# Patient Record
Sex: Female | Born: 1955 | Race: White | Hispanic: No | Marital: Married | Smoking: Never smoker
Health system: Southern US, Community
[De-identification: ages and names within clinical notes are randomized; demographics above are authoritative.]

## PROBLEM LIST (undated history)

## (undated) DIAGNOSIS — Z9889 Other specified postprocedural states: Secondary | ICD-10-CM

## (undated) HISTORY — PX: TUBAL LIGATION: SHX77

---
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
## 1997-09-03 HISTORY — PX: CHOLECYSTECTOMY: SHX55

## 2003-09-05 LAB — HM COLONOSCOPY: HM COLON: NORMAL

## 2009-11-02 ENCOUNTER — Ambulatory Visit: Payer: Self-pay | Admitting: Family Medicine

## 2010-09-03 HISTORY — PX: DILATION AND CURETTAGE OF UTERUS: SHX78

## 2010-09-28 ENCOUNTER — Emergency Department: Payer: Self-pay | Admitting: Emergency Medicine

## 2010-10-04 ENCOUNTER — Ambulatory Visit: Payer: Self-pay | Admitting: Family Medicine

## 2011-06-06 ENCOUNTER — Ambulatory Visit: Payer: Self-pay | Admitting: Internal Medicine

## 2012-07-02 ENCOUNTER — Ambulatory Visit: Payer: Self-pay | Admitting: Internal Medicine

## 2013-07-23 ENCOUNTER — Ambulatory Visit: Payer: Self-pay | Admitting: Internal Medicine

## 2013-12-28 IMAGING — MG MM DIGITAL SCREENING BILAT W/ CAD
1 series · 4 of 4 positions shown · non-contrast
Comparison: none

REASON FOR EXAM: SCR MAMMO NO ORDER
COMMENTS:

PROCEDURE:     MMM - MMM DGT SCR NO ORDER W/CAD  - July 02, 2012  [DATE]
RESULT:     Comparison is made to a prior study dated 06/06/2011.
The breasts demonstrate a mildly dense heterogeneous parenchymal pattern.
When compared to the previous study, there has been no significant change.

[Series 9613: R CC · right · 4 of 4 slices shown]
[im 1/4]
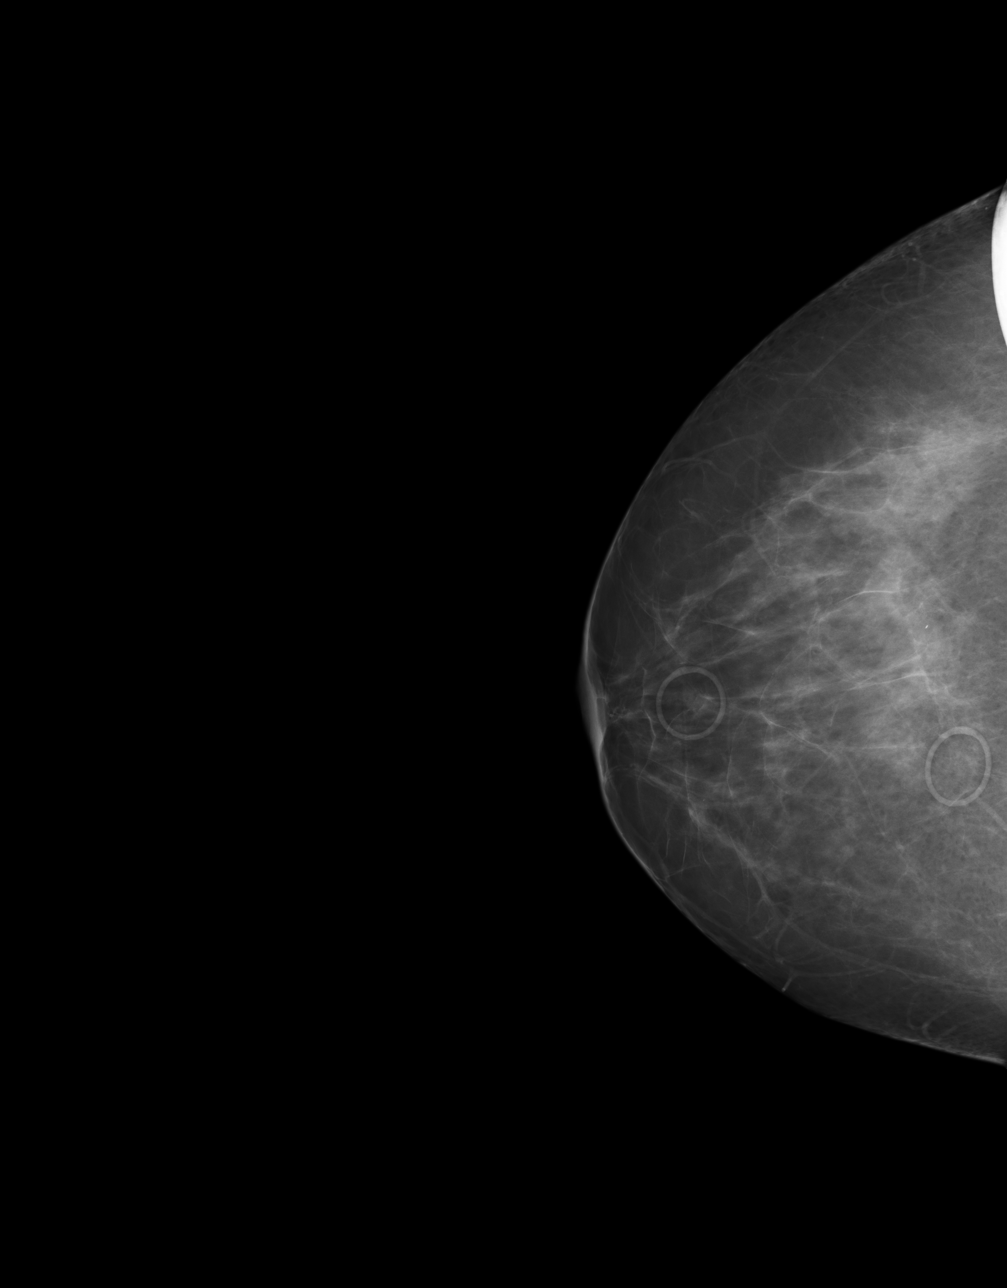
[im 2/4]
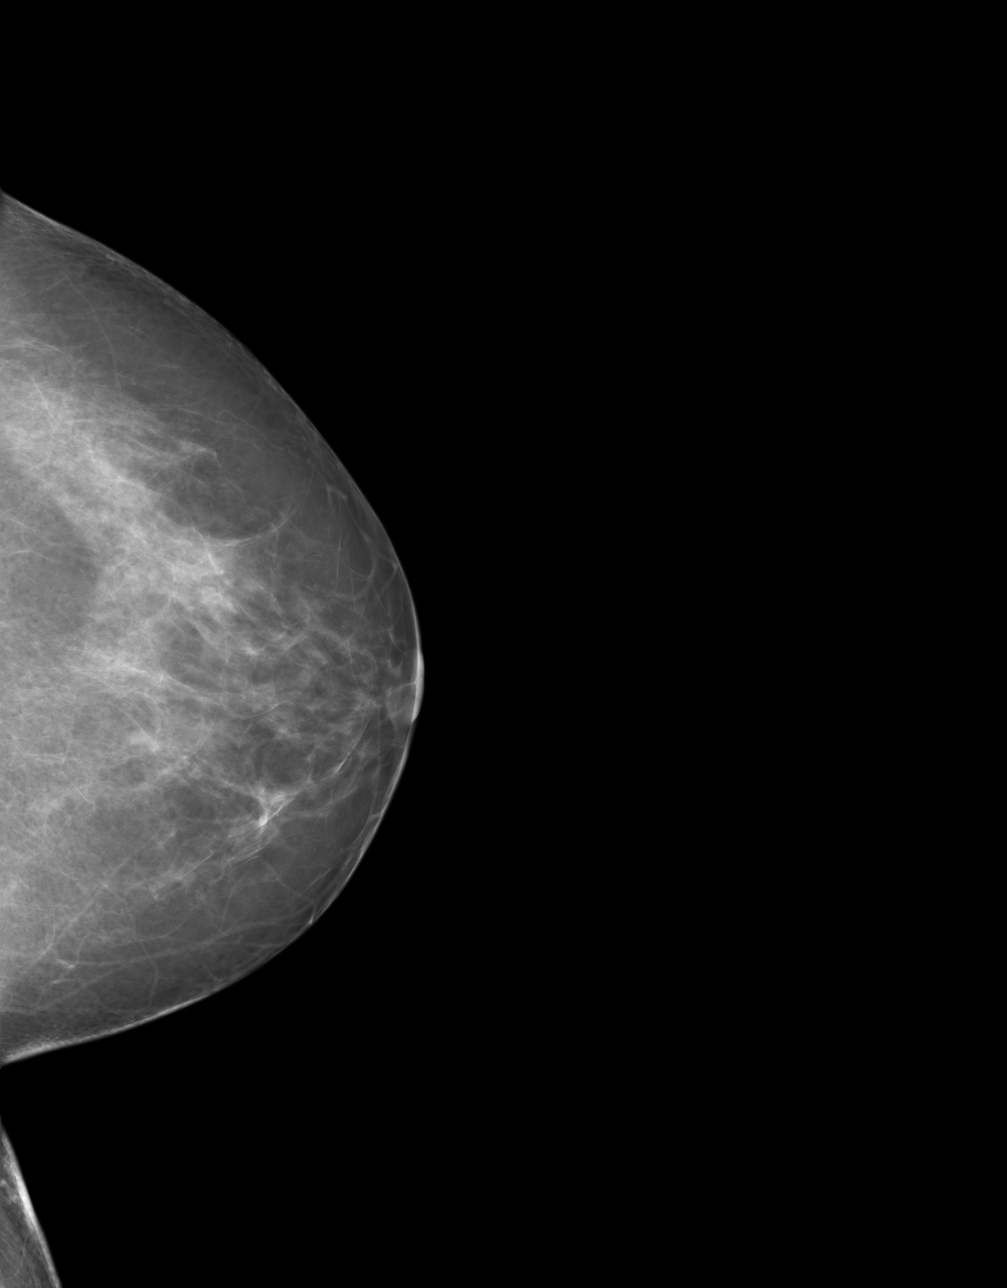
[im 3/4]
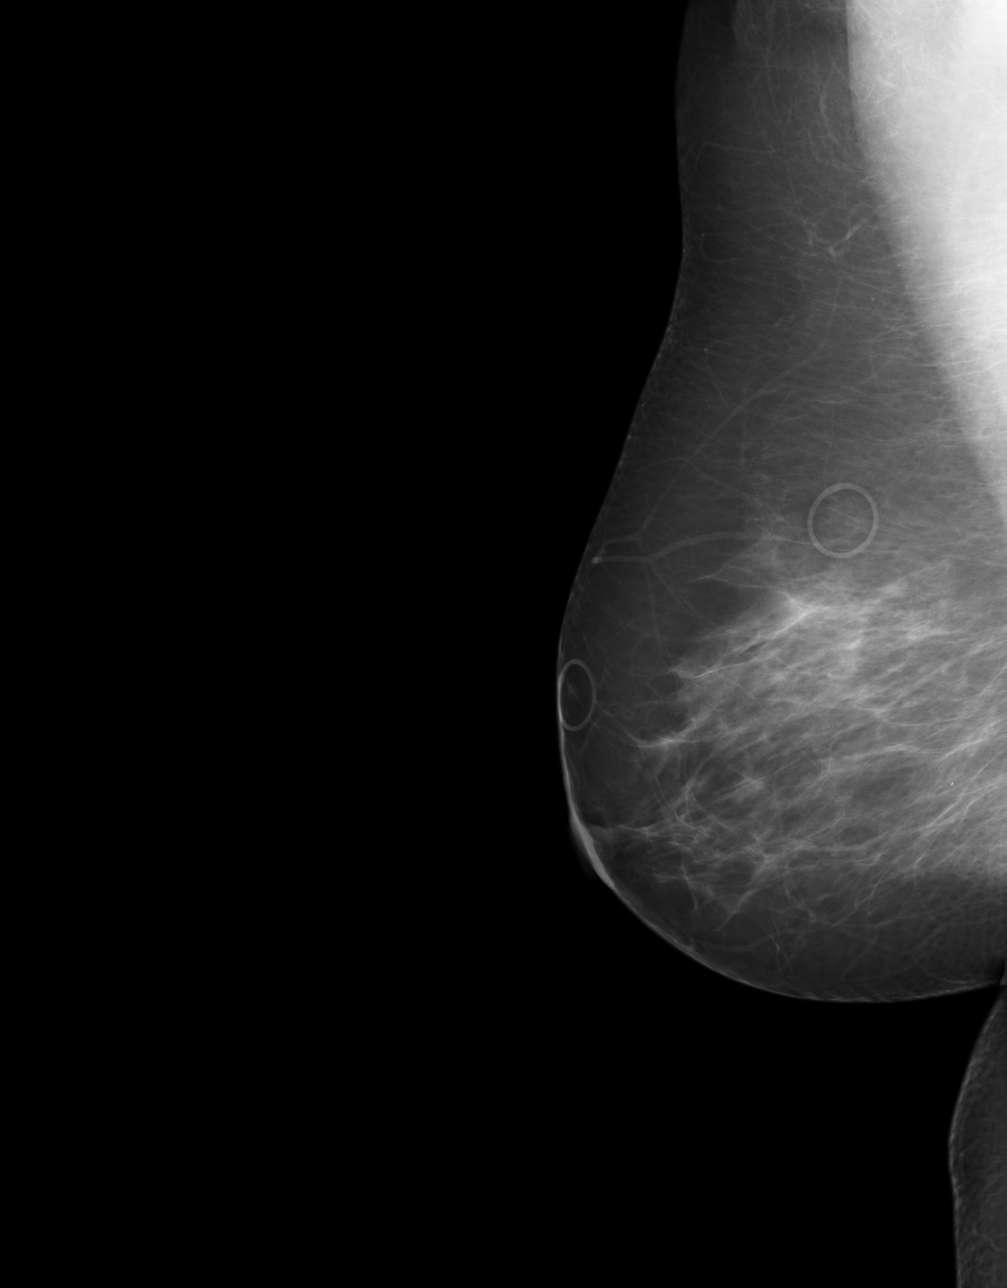
[im 4/4]
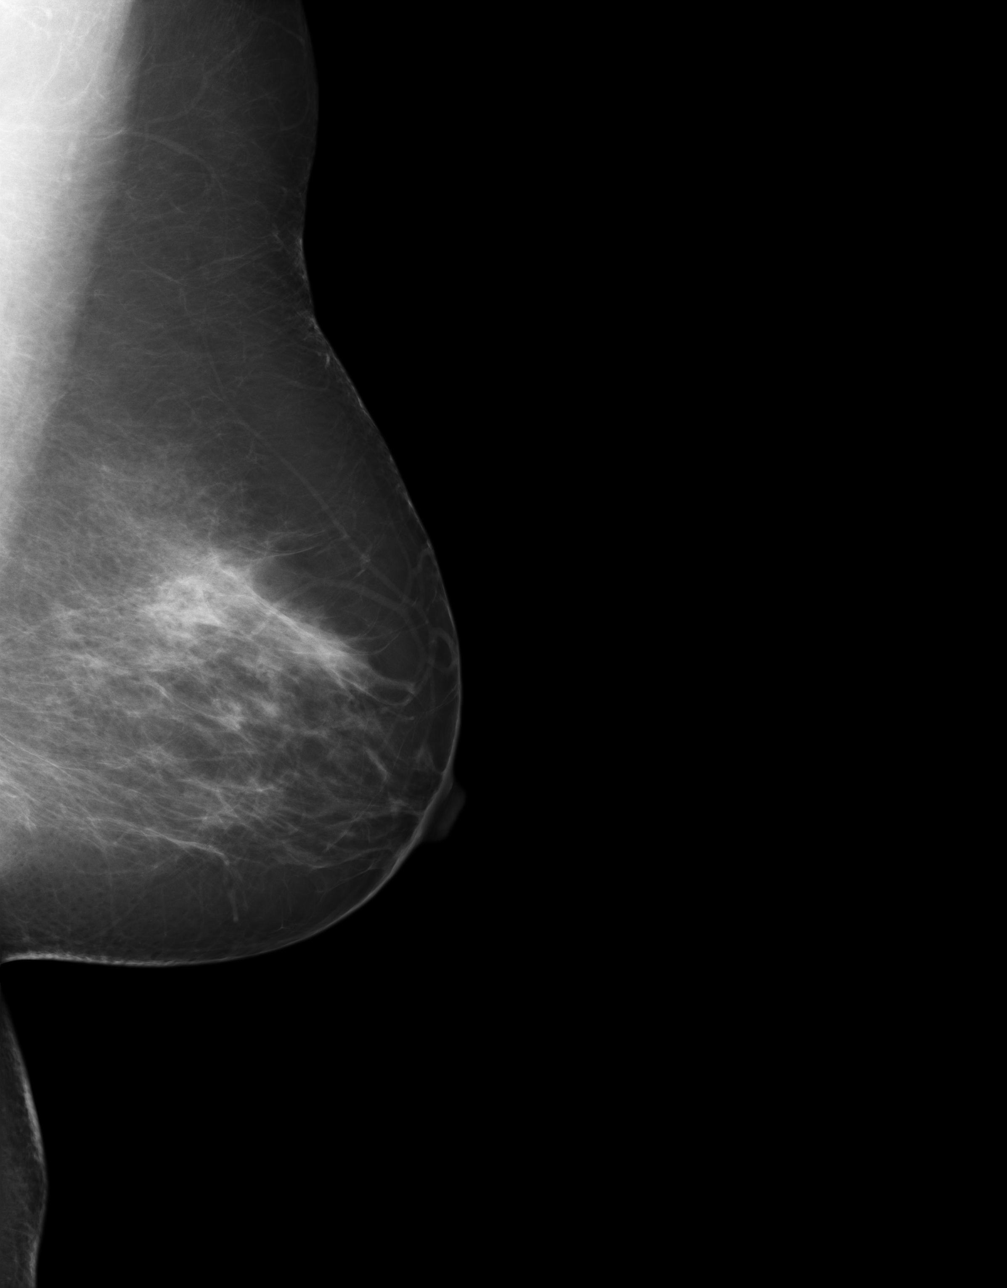

[4 of 4 positions shown; findings below may reference images not displayed]

IMPRESSION: BI-RADS: Category 2 - Benign Findings

Thank you for this opportunity to contribute to the care of your patient.

A NEGATIVE MAMMOGRAM REPORT DOES NOT PRECLUDE BIOPSY OR OTHER EVALUATION OF
A CLINICALLY PALPABLE OR OTHERWISE SUSPICIOUS MASS OR LESION. BREAST CANCER
MAY NOT BE DETECTED BY MAMMOGRAPHY IN UP TO 10% OF CASES.

## 2014-07-06 LAB — LIPID PANEL
Cholesterol: 196 mg/dL (ref 0–200)
HDL: 50 mg/dL (ref 35–70)
LDL CALC: 116 mg/dL
Triglycerides: 149 mg/dL (ref 40–160)

## 2014-07-06 LAB — CBC AND DIFFERENTIAL: HEMOGLOBIN: 13.2 g/dL (ref 12.0–16.0)

## 2014-07-06 LAB — TSH: TSH: 4.1 u[IU]/mL (ref <=5.90)

## 2014-10-12 ENCOUNTER — Ambulatory Visit: Payer: Self-pay | Admitting: Internal Medicine

## 2014-10-12 LAB — HM MAMMOGRAPHY: HM Mammogram: NORMAL

## 2015-03-18 ENCOUNTER — Encounter: Payer: Self-pay | Admitting: Internal Medicine

## 2015-03-18 ENCOUNTER — Other Ambulatory Visit: Payer: Self-pay | Admitting: Internal Medicine

## 2015-03-18 DIAGNOSIS — I10 Essential (primary) hypertension: Secondary | ICD-10-CM | POA: Insufficient documentation

## 2015-03-18 DIAGNOSIS — F3341 Major depressive disorder, recurrent, in partial remission: Secondary | ICD-10-CM | POA: Insufficient documentation

## 2015-03-18 DIAGNOSIS — R7989 Other specified abnormal findings of blood chemistry: Secondary | ICD-10-CM | POA: Insufficient documentation

## 2015-03-18 DIAGNOSIS — N951 Menopausal and female climacteric states: Secondary | ICD-10-CM | POA: Insufficient documentation

## 2015-03-18 DIAGNOSIS — G2581 Restless legs syndrome: Secondary | ICD-10-CM | POA: Insufficient documentation

## 2015-03-18 DIAGNOSIS — K219 Gastro-esophageal reflux disease without esophagitis: Secondary | ICD-10-CM | POA: Insufficient documentation

## 2015-03-22 ENCOUNTER — Other Ambulatory Visit: Payer: Self-pay | Admitting: Internal Medicine

## 2015-03-22 MED ORDER — ESTRADIOL-NORETHINDRONE ACET 0.5-0.1 MG PO TABS: 1.0000 | ORAL_TABLET | Freq: Every day | ORAL | Status: DC

## 2015-06-14 ENCOUNTER — Ambulatory Visit (INDEPENDENT_AMBULATORY_CARE_PROVIDER_SITE_OTHER): Payer: 59 | Admitting: Internal Medicine

## 2015-06-14 ENCOUNTER — Encounter: Payer: Self-pay | Admitting: Internal Medicine

## 2015-06-14 VITALS — BP 130/76 | HR 80 | Ht 62.0 in | Wt 199.2 lb

## 2015-06-14 DIAGNOSIS — R946 Abnormal results of thyroid function studies: Secondary | ICD-10-CM | POA: Diagnosis not present

## 2015-06-14 DIAGNOSIS — R7989 Other specified abnormal findings of blood chemistry: Secondary | ICD-10-CM

## 2015-06-14 DIAGNOSIS — F329 Major depressive disorder, single episode, unspecified: Secondary | ICD-10-CM | POA: Diagnosis not present

## 2015-06-14 DIAGNOSIS — F32A Depression, unspecified: Secondary | ICD-10-CM

## 2015-06-14 DIAGNOSIS — I10 Essential (primary) hypertension: Secondary | ICD-10-CM

## 2015-06-14 DIAGNOSIS — G2581 Restless legs syndrome: Secondary | ICD-10-CM | POA: Diagnosis not present

## 2015-06-14 MED ORDER — ESTRADIOL-NORETHINDRONE ACET 0.5-0.1 MG PO TABS: 1.0000 | ORAL_TABLET | Freq: Every day | ORAL | Status: DC

## 2015-06-14 MED ORDER — SERTRALINE HCL 100 MG PO TABS: 100.0000 mg | ORAL_TABLET | Freq: Every day | ORAL | Status: DC

## 2015-06-14 MED ORDER — GABAPENTIN 300 MG PO CAPS: 600.0000 mg | ORAL_CAPSULE | Freq: Every day | ORAL | Status: DC

## 2015-06-14 MED ORDER — METOPROLOL SUCCINATE ER 25 MG PO TB24: 25.0000 mg | ORAL_TABLET | Freq: Every day | ORAL | Status: DC

## 2015-06-14 MED ORDER — LISINOPRIL 40 MG PO TABS: 40.0000 mg | ORAL_TABLET | Freq: Every day | ORAL | Status: DC

## 2015-06-14 NOTE — Progress Notes (Signed)
Date:  06/14/2015   Name:  Faith Coleman   DOB:  [birth year]   MRN:  [id number]   Chief Complaint: Hypertension and Depression Hypertension This is a chronic problem. The current episode started more than 1 year ago. The problem is unchanged. The problem is controlled. Associated symptoms include palpitations. Pertinent negatives include no chest pain, headaches or shortness of breath. There are no associated agents to hypertension. Past treatments include ACE inhibitors and beta blockers. The current treatment provides significant improvement. There are no compliance problems.   Depression        This is a chronic problem.  The current episode started 1 to 4 weeks ago.   The onset quality is gradual.   The problem occurs rarely.  The problem has been resolved since onset.  Associated symptoms include no fatigue, no appetite change and no headaches.  Past treatments include SSRIs - Selective serotonin reuptake inhibitors.  Compliance with treatment is good.  Previous treatment provided significant relief. Obesity - patient is discouraged with her inability to lose weight despite multiple programs such as Weight Watchers. She is trouble exercising due to some knee pain so relies on dietary changes. Previously she had elevated TSH for thyroid functions were normal. She's wondering if that is more of a problem now as multiple family members have hypothyroidism.   Review of Systems:  Review of Systems  Constitutional: Negative for appetite change and fatigue.  HENT: Negative for hearing loss.   Respiratory: Negative for cough, chest tightness and shortness of breath.   Cardiovascular: Positive for palpitations. Negative for chest pain and leg swelling.  Gastrointestinal: Negative for abdominal pain and constipation.  Endocrine: Negative for polydipsia and polyuria.  Genitourinary: Negative for hematuria, vaginal discharge and pelvic pain.  Musculoskeletal: Positive for arthralgias.  Skin:  Negative for color change and rash.  Neurological: Negative for headaches.  Hematological: Negative for adenopathy.  Psychiatric/Behavioral: Positive for depression. Negative for sleep disturbance and dysphoric mood.    Patient Active Problem List   Diagnosis Date Noted  . Clinical depression 03/18/2015  . Elevated TSH 03/18/2015  . Essential (primary) hypertension 03/18/2015  . Acid reflux 03/18/2015  . Hot flash, menopausal 03/18/2015  . Restless leg 03/18/2015    Prior to Admission medications   Medication Sig Start Date End Date Taking? Authorizing Provider  Estradiol-Norethindrone Acet (ACTIVELLA) 0.5-0.1 MG per tablet Take 1 tablet by mouth daily. 03/22/15  Yes Reubin Milan, MD  gabapentin (NEURONTIN) 300 MG capsule TAKE 2 CAPSULE(S) BY MOUTH AT BEDTIME 03/18/15  Yes Reubin Milan, MD  lisinopril (PRINIVIL,ZESTRIL) 40 MG tablet Take 1 tablet by mouth daily. 06/24/14  Yes Historical Provider, MD  metoprolol succinate (TOPROL-XL) 25 MG 24 hr tablet TAKE 1 TABLET BY MOUTH DAILY 03/18/15  Yes Reubin Milan, MD  sertraline (ZOLOFT) 100 MG tablet Take 1 tablet by mouth daily. 06/24/14  Yes Historical Provider, MD    No Known Allergies  Past Surgical History  Procedure Laterality Date  . Dilation and curettage of uterus  2012    benign  . Tubal ligation    . Cholecystectomy  1999    Social History  Substance Use Topics  . Smoking status: Never Smoker   . Smokeless tobacco: None  . Alcohol Use: 1.2 oz/week    2 Standard drinks or equivalent per week     Medication list has been reviewed and updated.  Physical Examination:  Physical Exam  Constitutional: She is oriented to person, place,  and time. She appears well-developed and well-nourished.  Neck: Normal range of motion. Neck supple. No thyromegaly present.  Cardiovascular: Normal rate, regular rhythm and normal heart sounds.   No murmur heard. Pulmonary/Chest: Effort normal and breath sounds normal.   Musculoskeletal: She exhibits no edema or tenderness.  Neurological: She is alert and oriented to person, place, and time.  Reflex Scores:      Bicep reflexes are 4+ on the right side and 4+ on the left side.      Patellar reflexes are 4+ on the right side and 4+ on the left side. Skin: Skin is warm and dry. No erythema.  Psychiatric: She has a normal mood and affect. Her behavior is normal.  Nursing note and vitals reviewed.   BP 130/76 mmHg  Pulse 80  Ht  (1.575 m)  Wt 199 lb 3.2 oz (90.357 kg)  BMI 36.43 kg/m2  Assessment and Plan: 1. Essential (primary) hypertension Controlled current regimen - lisinopril (PRINIVIL,ZESTRIL) 40 MG tablet; Take 1 tablet (40 mg total) by mouth daily.  Dispense: 90 tablet; Refill: 3 - metoprolol succinate (TOPROL-XL) 25 MG 24 hr tablet; Take 1 tablet (25 mg total) by mouth daily.  Dispense: 90 tablet; Refill: 3  2. Clinical depression Doing well continue sertraline - sertraline (ZOLOFT) 100 MG tablet; Take 1 tablet (100 mg total) by mouth daily.  Dispense: 90 tablet; Refill: 3  3. Restless leg Stable on gabapentin - gabapentin (NEURONTIN) 300 MG capsule; Take 2 capsules (600 mg total) by mouth at bedtime.  Dispense: 180 capsule; Refill: 3  4. Elevated TSH Will recheck panel in view of patient's inability to lose weight Discussed other treatment options at physical in November - Thyroid Panel With TSH   Bari Edward, MD [facility]  06/14/2015

## 2015-06-15 LAB — THYROID PANEL WITH TSH
Free Thyroxine Index: 1.8 (ref 1.2–4.9)
T3 Uptake Ratio: 23 % — ABNORMAL LOW (ref 24–39)
T4 TOTAL: 7.8 ug/dL (ref 4.5–12.0)
TSH: 4.03 u[IU]/mL (ref 0.450–4.500)

## 2015-08-03 ENCOUNTER — Encounter: Payer: Self-pay | Admitting: Internal Medicine

## 2015-09-14 ENCOUNTER — Other Ambulatory Visit: Payer: Self-pay | Admitting: Internal Medicine

## 2015-10-25 ENCOUNTER — Ambulatory Visit (INDEPENDENT_AMBULATORY_CARE_PROVIDER_SITE_OTHER): Payer: 59 | Admitting: Internal Medicine

## 2015-10-25 ENCOUNTER — Encounter: Payer: Self-pay | Admitting: Internal Medicine

## 2015-10-25 VITALS — BP 118/74 | HR 76 | Ht 62.0 in | Wt 200.6 lb

## 2015-10-25 DIAGNOSIS — I10 Essential (primary) hypertension: Secondary | ICD-10-CM

## 2015-10-25 DIAGNOSIS — Z1159 Encounter for screening for other viral diseases: Secondary | ICD-10-CM | POA: Diagnosis not present

## 2015-10-25 DIAGNOSIS — Z0000 Encounter for general adult medical examination without abnormal findings: Secondary | ICD-10-CM

## 2015-10-25 DIAGNOSIS — G2581 Restless legs syndrome: Secondary | ICD-10-CM

## 2015-10-25 DIAGNOSIS — R946 Abnormal results of thyroid function studies: Secondary | ICD-10-CM

## 2015-10-25 DIAGNOSIS — R7989 Other specified abnormal findings of blood chemistry: Secondary | ICD-10-CM

## 2015-10-25 DIAGNOSIS — F324 Major depressive disorder, single episode, in partial remission: Secondary | ICD-10-CM

## 2015-10-25 LAB — POCT URINALYSIS DIPSTICK
BILIRUBIN UA: NEGATIVE
Blood, UA: NEGATIVE
Glucose, UA: NEGATIVE
KETONES UA: NEGATIVE
LEUKOCYTES UA: NEGATIVE
Nitrite, UA: NEGATIVE
Protein, UA: NEGATIVE
SPEC GRAV UA: 1.01
Urobilinogen, UA: 0.2
pH, UA: 6

## 2015-10-25 NOTE — Patient Instructions (Signed)
**CALL WITH GI NAME AND FAX FOR COLONOSCOPY REFERRAL.  DASH Eating Plan DASH stands for "Dietary Approaches to Stop Hypertension." The DASH eating plan is a healthy eating plan that has been shown to reduce high blood pressure (hypertension). Additional health benefits may include reducing the risk of type 2 diabetes mellitus, heart disease, and stroke. The DASH eating plan may also help with weight loss. WHAT DO I NEED TO KNOW ABOUT THE DASH EATING PLAN? For the DASH eating plan, you will follow these general guidelines:  Choose foods with a percent daily value for sodium of less than 5% (as listed on the food label).  Use salt-free seasonings or herbs instead of table salt or sea salt.  Check with your health care provider or pharmacist before using salt substitutes.  Eat lower-sodium products, often labeled as "lower sodium" or "no salt added."  Eat fresh foods.  Eat more vegetables, fruits, and low-fat dairy products.  Choose whole grains. Look for the word "whole" as the first word in the ingredient list.  Choose fish and skinless chicken or [place] more often than red meat. Limit fish, poultry, and meat to 6 oz (170 g) each day.  Limit sweets, desserts, sugars, and sugary drinks.  Choose heart-healthy fats.  Limit cheese to 1 oz (28 g) per day.  Eat more home-cooked food and less restaurant, buffet, and fast food.  Limit fried foods.  Cook foods using methods other than frying.  Limit canned vegetables. If you do use them, rinse them well to decrease the sodium.  When eating at a restaurant, ask that your food be prepared with less salt, or no salt if possible. WHAT FOODS CAN I EAT? Seek help from a dietitian for individual calorie needs. Grains Whole grain or whole wheat bread. Brown rice. Whole grain or whole wheat pasta. Quinoa, bulgur, and whole grain cereals. Low-sodium cereals. Corn or whole wheat flour tortillas. Whole grain cornbread. Whole grain crackers.  Low-sodium crackers. Vegetables Fresh or frozen vegetables (raw, steamed, roasted, or grilled). Low-sodium or reduced-sodium tomato and vegetable juices. Low-sodium or reduced-sodium tomato sauce and paste. Low-sodium or reduced-sodium canned vegetables.  Fruits All fresh, canned (in natural juice), or frozen fruits. Meat and Other Protein Products Ground beef (85% or leaner), grass-fed beef, or beef trimmed of fat. Skinless chicken or [place]. Ground chicken or [place]. Pork trimmed of fat. All fish and seafood. Eggs. Dried beans, peas, or lentils. Unsalted nuts and seeds. Unsalted canned beans. Dairy Low-fat dairy products, such as skim or 1% milk, 2% or reduced-fat cheeses, low-fat ricotta or cottage cheese, or plain low-fat yogurt. Low-sodium or reduced-sodium cheeses. Fats and Oils Tub margarines without trans fats. Light or reduced-fat mayonnaise and salad dressings (reduced sodium). Avocado. Safflower, olive, or canola oils. Natural peanut or almond butter. Other Unsalted popcorn and pretzels. The items listed above may not be a complete list of recommended foods or beverages. Contact your dietitian for more options. WHAT FOODS ARE NOT RECOMMENDED? Grains White bread. White pasta. White rice. Refined cornbread. Bagels and croissants. Crackers that contain trans fat. Vegetables Creamed or fried vegetables. Vegetables in a cheese sauce. Regular canned vegetables. Regular canned tomato sauce and paste. Regular tomato and vegetable juices. Fruits Dried fruits. Canned fruit in light or heavy syrup. Fruit juice. Meat and Other Protein Products Fatty cuts of meat. Ribs, chicken wings, bacon, sausage, bologna, salami, chitterlings, fatback, hot dogs, bratwurst, and packaged luncheon meats. Salted nuts and seeds. Canned beans with salt. Dairy Whole or 2% milk,  cream, half-and-half, and cream cheese. Whole-fat or sweetened yogurt. Full-fat cheeses or blue cheese. Nondairy creamers and whipped  toppings. Processed cheese, cheese spreads, or cheese curds. Condiments Onion and garlic salt, seasoned salt, table salt, and sea salt. Canned and packaged gravies. Worcestershire sauce. Tartar sauce. Barbecue sauce. Teriyaki sauce. Soy sauce, including reduced sodium. Steak sauce. Fish sauce. Oyster sauce. Cocktail sauce. Horseradish. Ketchup and mustard. Meat flavorings and tenderizers. Bouillon cubes. Hot sauce. Tabasco sauce. Marinades. Taco seasonings. Relishes. Fats and Oils Butter, stick margarine, lard, shortening, ghee, and bacon fat. Coconut, palm kernel, or palm oils. Regular salad dressings. Other Pickles and olives. Salted popcorn and pretzels. The items listed above may not be a complete list of foods and beverages to avoid. Contact your dietitian for more information. WHERE CAN I FIND MORE INFORMATION? National Heart, Lung, and Blood Institute: travelstabloid.com   This information is not intended to replace advice given to you by your health care provider. Make sure you discuss any questions you have with your health care provider.   Document Released: 08/09/2011 Document Revised: 09/10/2014 Document Reviewed: 06/24/2013 Elsevier Interactive Patient Education [employer].

## 2015-10-25 NOTE — Progress Notes (Signed)
Date:  10/25/2015   Name:  Faith Coleman   DOB:  11-10-1955   MRN:  [id number]   Chief Complaint: Annual Exam Faith Coleman is a 60 y.o. female who presents today for her Complete Annual Exam. She feels fairly well. She reports exercising none. She reports she is sleeping fairly well. She gets mammograms and Pap smears done by her gynecologist in [place]. She is due for colonoscopy and will find a GI in [place] and call with that name for the referral.   Hypertension This is a chronic problem. The current episode started more than 1 year ago. The problem is unchanged. The problem is controlled. Associated symptoms include palpitations. Pertinent negatives include no chest pain or shortness of breath. Past treatments include ACE inhibitors and beta blockers. The current treatment provides significant improvement.  Depression        The current episode started more than 1 year ago.   The problem occurs rarely.  The problem has been resolved since onset.     The symptoms are aggravated by family issues and work stress.  Past treatments include SSRIs - Selective serotonin reuptake inhibitors.  Compliance with treatment is good.  Previous treatment provided significant relief.  Restless leg syndrome - had done well with gabapentin at hs.  The movement disorder has resolved but wakes early in the AM with aching legs.   Review of Systems  Constitutional: Negative for fever, chills and diaphoresis.  Respiratory: Negative for choking, chest tightness, shortness of breath and wheezing.   Cardiovascular: Positive for palpitations. Negative for chest pain and leg swelling.  Gastrointestinal: Negative for vomiting, diarrhea, constipation and blood in stool.  Genitourinary: Negative for dysuria, urgency, frequency and hematuria.  Hematological: Negative for adenopathy. Does not bruise/bleed easily.  Psychiatric/Behavioral: Positive for depression and sleep disturbance. Negative  for dysphoric mood. The patient is not nervous/anxious.     Patient Active Problem List   Diagnosis Date Noted  . Clinical depression 03/18/2015  . Elevated TSH 03/18/2015  . Essential (primary) hypertension 03/18/2015  . Acid reflux 03/18/2015  . Hot flash, menopausal 03/18/2015  . Restless leg 03/18/2015    Prior to Admission medications   Medication Sig Start Date End Date Taking? Authorizing Provider  Estradiol-Norethindrone Acet 0.5-0.1 MG tablet TAKE 1 TABLET BY MOUTH DAILY 09/14/15  Yes Reubin Milan, MD  gabapentin (NEURONTIN) 300 MG capsule Take 2 capsules (600 mg total) by mouth at bedtime. 06/14/15  Yes Reubin Milan, MD  lisinopril (PRINIVIL,ZESTRIL) 40 MG tablet Take 1 tablet (40 mg total) by mouth daily. 06/14/15  Yes Reubin Milan, MD  metoprolol succinate (TOPROL-XL) 25 MG 24 hr tablet Take 1 tablet (25 mg total) by mouth daily. 06/14/15  Yes Reubin Milan, MD  sertraline (ZOLOFT) 100 MG tablet Take 1 tablet (100 mg total) by mouth daily. 06/14/15  Yes Reubin Milan, MD    No Known Allergies  Past Surgical History  Procedure Laterality Date  . Dilation and curettage of uterus  2012    benign  . Tubal ligation    . Cholecystectomy  1999    Social History  Substance Use Topics  . Smoking status: Never Smoker   . Smokeless tobacco: None  . Alcohol Use: 1.2 oz/week    2 Standard drinks or equivalent per week     Comment: occasional     Medication list has been reviewed and updated.   Physical Exam  Constitutional: She is  oriented to person, place, and time. She appears well-developed and well-nourished. No distress.  HENT:  Head: Normocephalic and atraumatic.  Right Ear: Tympanic membrane and ear canal normal.  Left Ear: Tympanic membrane and ear canal normal.  Nose: Right sinus exhibits no maxillary sinus tenderness. Left sinus exhibits no maxillary sinus tenderness.  Mouth/Throat: Uvula is midline and oropharynx is clear and moist.    Eyes: Conjunctivae and EOM are normal. Right eye exhibits no discharge. Left eye exhibits no discharge. No scleral icterus.  Neck: Normal range of motion. Carotid bruit is not present. No erythema present. No thyromegaly present.  Cardiovascular: Normal rate, regular rhythm, normal heart sounds and normal pulses.   Pulmonary/Chest: Effort normal and breath sounds normal. No respiratory distress. She has no wheezes.  Abdominal: Soft. Bowel sounds are normal. There is no hepatosplenomegaly. There is no tenderness. There is no CVA tenderness.  Musculoskeletal: Normal range of motion. She exhibits no edema or tenderness.  Lymphadenopathy:    She has no cervical adenopathy.    She has no axillary adenopathy.  Neurological: She is alert and oriented to person, place, and time. She has normal reflexes. No cranial nerve deficit or sensory deficit.  Skin: Skin is warm, dry and intact. No rash noted.  Psychiatric: She has a normal mood and affect. Her speech is normal and behavior is normal. Thought content normal.  Nursing note and vitals reviewed.   BP 118/74 mmHg  Pulse 76  Ht  (1.575 m)  Wt 200 lb 9.6 oz (90.992 kg)  BMI 36.68 kg/m2  LMP 10/11/2015  Assessment and Plan: 1. Annual physical exam Call with GI for colon referral Dermatology skin survey annually Continue to see GYN for pap and mammograms - Lipid panel - Hemoglobin A1c  2. Restless leg Increase gabapentin up to 1200 mg at hs - CBC with Differential/Platelet  3. Essential (primary) hypertension Controlled - discussed regular exercise for weight control - Comprehensive metabolic panel - POCT urinalysis dipstick  4. Major depressive disorder with single episode, in partial remission (HCC) Doing well on Zoloft  5. Elevated TSH Will continue to monitor - TSH  6. Need for hepatitis C screening test - Hepatitis C antibody   Bari Edward, MD [facility]  10/25/2015

## 2015-10-26 ENCOUNTER — Telehealth: Payer: Self-pay

## 2015-10-26 LAB — CBC WITH DIFFERENTIAL/PLATELET
BASOS: 1 %
Basophils Absolute: 0 10*3/uL (ref 0.0–0.2)
EOS (ABSOLUTE): 0.2 10*3/uL (ref 0.0–0.4)
EOS: 3 %
HEMATOCRIT: 40.3 % (ref 34.0–46.6)
Hemoglobin: 13.1 g/dL (ref 11.1–15.9)
IMMATURE GRANS (ABS): 0 10*3/uL (ref 0.0–0.1)
IMMATURE GRANULOCYTES: 0 %
LYMPHS: 33 %
Lymphocytes Absolute: 1.9 10*3/uL (ref 0.7–3.1)
MCH: 29.4 pg (ref 26.6–33.0)
MCHC: 32.5 g/dL (ref 31.5–35.7)
MCV: 90 fL (ref 79–97)
MONOS ABS: 0.4 10*3/uL (ref 0.1–0.9)
Monocytes: 8 %
Neutrophils Absolute: 3.3 10*3/uL (ref 1.4–7.0)
Neutrophils: 55 %
Platelets: 266 10*3/uL (ref 150–379)
RBC: 4.46 x10E6/uL (ref 3.77–5.28)
RDW: 14.2 % (ref 12.3–15.4)
WBC: 5.8 10*3/uL (ref 3.4–10.8)

## 2015-10-26 LAB — LIPID PANEL
CHOL/HDL RATIO: 3.4 ratio (ref 0.0–4.4)
CHOLESTEROL TOTAL: 198 mg/dL (ref 100–199)
HDL: 58 mg/dL (ref >39)
LDL CALC: 116 mg/dL — AB (ref 0–99)
TRIGLYCERIDES: 119 mg/dL (ref 0–149)
VLDL CHOLESTEROL CAL: 24 mg/dL (ref 5–40)

## 2015-10-26 LAB — HEMOGLOBIN A1C
Est. average glucose Bld gHb Est-mCnc: 114 mg/dL
Hgb A1c MFr Bld: 5.6 % (ref 4.8–5.6)

## 2015-10-26 LAB — COMPREHENSIVE METABOLIC PANEL
ALK PHOS: 78 IU/L (ref 39–117)
ALT: 13 IU/L (ref 0–32)
AST: 11 IU/L (ref 0–40)
Albumin/Globulin Ratio: 1.6 (ref 1.1–2.5)
Albumin: 4.2 g/dL (ref 3.5–5.5)
BUN/Creatinine Ratio: 16 (ref 9–23)
BUN: 12 mg/dL (ref 6–24)
Bilirubin Total: 0.4 mg/dL (ref 0.0–1.2)
CALCIUM: 9.3 mg/dL (ref 8.7–10.2)
CO2: 25 mmol/L (ref 18–29)
CREATININE: 0.75 mg/dL (ref 0.57–1.00)
Chloride: 101 mmol/L (ref 96–106)
GFR calc Af Amer: 102 mL/min/{1.73_m2} (ref >59)
GFR, EST NON AFRICAN AMERICAN: 89 mL/min/{1.73_m2} (ref >59)
GLOBULIN, TOTAL: 2.6 g/dL (ref 1.5–4.5)
GLUCOSE: 94 mg/dL (ref 65–99)
Potassium: 4.7 mmol/L (ref 3.5–5.2)
SODIUM: 140 mmol/L (ref 134–144)
Total Protein: 6.8 g/dL (ref 6.0–8.5)

## 2015-10-26 LAB — TSH: TSH: 3.83 u[IU]/mL (ref 0.450–4.500)

## 2015-10-26 LAB — HEPATITIS C ANTIBODY: Hep C Virus Ab: <0.1 s/co ratio (ref 0.0–0.9)

## 2015-10-26 NOTE — Telephone Encounter (Signed)
Left message for patient to call back  

## 2015-10-26 NOTE — Telephone Encounter (Signed)
-----   Message from Reubin Milan, MD sent at 10/26/2015  7:52 AM EST ----- Labs are normal.  Cholesterol is excellent.  Hep C is negative.

## 2015-10-27 NOTE — Telephone Encounter (Signed)
Spoke with patient. Patient advised of all results and verbalized understanding. Will call back with any future questions or concerns. MAH  

## 2015-11-30 ENCOUNTER — Other Ambulatory Visit: Payer: Self-pay

## 2015-11-30 DIAGNOSIS — G2581 Restless legs syndrome: Secondary | ICD-10-CM

## 2015-11-30 MED ORDER — GABAPENTIN 300 MG PO CAPS: 1200.0000 mg | ORAL_CAPSULE | Freq: Every day | ORAL | Status: DC

## 2015-11-30 NOTE — Telephone Encounter (Signed)
Patient called requesting an update on her medication. States that she should be taking 4 tablets at bedtime.

## 2016-04-03 HISTORY — PX: ENDOMETRIAL BIOPSY: SHX622

## 2016-04-30 ENCOUNTER — Encounter: Payer: Self-pay | Admitting: Internal Medicine

## 2016-04-30 ENCOUNTER — Ambulatory Visit (INDEPENDENT_AMBULATORY_CARE_PROVIDER_SITE_OTHER): Payer: 59 | Admitting: Internal Medicine

## 2016-04-30 VITALS — BP 120/80 | HR 73 | Temp 98.3°F | Resp 16 | Ht 62.0 in | Wt 201.2 lb

## 2016-04-30 DIAGNOSIS — Z1211 Encounter for screening for malignant neoplasm of colon: Secondary | ICD-10-CM

## 2016-04-30 DIAGNOSIS — R300 Dysuria: Secondary | ICD-10-CM

## 2016-04-30 DIAGNOSIS — I10 Essential (primary) hypertension: Secondary | ICD-10-CM | POA: Diagnosis not present

## 2016-04-30 DIAGNOSIS — N951 Menopausal and female climacteric states: Secondary | ICD-10-CM | POA: Diagnosis not present

## 2016-04-30 DIAGNOSIS — G2581 Restless legs syndrome: Secondary | ICD-10-CM

## 2016-04-30 DIAGNOSIS — F329 Major depressive disorder, single episode, unspecified: Secondary | ICD-10-CM

## 2016-04-30 DIAGNOSIS — F32A Depression, unspecified: Secondary | ICD-10-CM

## 2016-04-30 DIAGNOSIS — F3341 Major depressive disorder, recurrent, in partial remission: Secondary | ICD-10-CM | POA: Diagnosis not present

## 2016-04-30 LAB — POCT URINALYSIS DIPSTICK
BILIRUBIN UA: NEGATIVE
GLUCOSE UA: NEGATIVE
Ketones, UA: NEGATIVE
Leukocytes, UA: NEGATIVE
NITRITE UA: NEGATIVE
Protein, UA: NEGATIVE
RBC UA: NEGATIVE
Spec Grav, UA: >=1.03
pH, UA: 7

## 2016-04-30 MED ORDER — SERTRALINE HCL 100 MG PO TABS: 100.0000 mg | ORAL_TABLET | Freq: Every day | ORAL | 3 refills | Status: DC

## 2016-04-30 MED ORDER — CIPROFLOXACIN HCL 250 MG PO TABS: 250.0000 mg | ORAL_TABLET | Freq: Two times a day (BID) | ORAL | 0 refills | Status: DC

## 2016-04-30 MED ORDER — LISINOPRIL 40 MG PO TABS: 40.0000 mg | ORAL_TABLET | Freq: Every day | ORAL | 3 refills | Status: DC

## 2016-04-30 MED ORDER — METOPROLOL SUCCINATE ER 25 MG PO TB24: 25.0000 mg | ORAL_TABLET | Freq: Every day | ORAL | 3 refills | Status: DC

## 2016-04-30 MED ORDER — GABAPENTIN 300 MG PO CAPS: 1800.0000 mg | ORAL_CAPSULE | Freq: Every day | ORAL | 3 refills | Status: DC

## 2016-04-30 NOTE — Progress Notes (Signed)
Date:  04/30/2016   Name:  Faith Coleman   DOB:  07-30-1956   MRN:  [id number]   Chief Complaint: Hypertension (BP has been good. ); Depression (Controlled. ); and Dysuria (3 weeks off and on. ) Hypertension  This is a chronic problem. The current episode started more than 1 year ago. The problem is unchanged. The problem is controlled. Pertinent negatives include no chest pain, headaches, palpitations or shortness of breath. Risk factors for coronary artery disease include smoking/tobacco exposure. Past treatments include ACE inhibitors and beta blockers. The current treatment provides significant improvement.  Depression         Associated symptoms include no fatigue, no appetite change and no headaches.  Dysuria - onset 3 weeks ago.  Had positive dipstick at work.  Has been drinking more water that helped but started to have more symptoms 4 days ago.  Restless leg - did better with higher dose gabapentin.  Added a 100 mg in the afternoon.  Would like to add a higher dose earlier in the day.  Needs refills.  Review of Systems  Constitutional: Negative for appetite change, fatigue, fever and unexpected weight change.  HENT: Negative for tinnitus and trouble swallowing.   Eyes: Negative for visual disturbance.  Respiratory: Negative for cough, chest tightness and shortness of breath.   Cardiovascular: Negative for chest pain, palpitations and leg swelling.  Gastrointestinal: Negative for abdominal pain.  Endocrine: Negative for polydipsia and polyuria.  Genitourinary: Positive for dysuria and vaginal bleeding. Negative for frequency, hematuria and urgency.  Musculoskeletal: Negative for arthralgias.  Neurological: Negative for tremors, numbness and headaches.  Psychiatric/Behavioral: Positive for depression and sleep disturbance (from leg sx). Negative for dysphoric mood. The patient is not nervous/anxious.     Patient Active Problem List   Diagnosis Date Noted  . Major  depression in partial remission (HCC) 03/18/2015  . Elevated TSH 03/18/2015  . Essential (primary) hypertension 03/18/2015  . Acid reflux 03/18/2015  . Hot flash, menopausal 03/18/2015  . Restless leg 03/18/2015    Prior to Admission medications   Medication Sig Start Date End Date Taking? Authorizing Provider  Estradiol-Norethindrone Acet 0.5-0.1 MG tablet TAKE 1 TABLET BY MOUTH DAILY 09/14/15  Yes Reubin Milan, MD  gabapentin (NEURONTIN) 300 MG capsule Take 4 capsules (1,200 mg total) by mouth at bedtime. 11/30/15  Yes Reubin Milan, MD  lisinopril (PRINIVIL,ZESTRIL) 40 MG tablet Take 1 tablet (40 mg total) by mouth daily. 06/14/15  Yes Reubin Milan, MD  metoprolol succinate (TOPROL-XL) 25 MG 24 hr tablet Take 1 tablet (25 mg total) by mouth daily. 06/14/15  Yes Reubin Milan, MD  sertraline (ZOLOFT) 100 MG tablet Take 1 tablet (100 mg total) by mouth daily. 06/14/15  Yes Reubin Milan, MD    No Known Allergies  Past Surgical History:  Procedure Laterality Date  . CHOLECYSTECTOMY  1999  . DILATION AND CURETTAGE OF UTERUS  2012   benign  . TUBAL LIGATION      Social History  Substance Use Topics  . Smoking status: Never Smoker  . Smokeless tobacco: Never Used  . Alcohol use 1.2 oz/week    2 Standard drinks or equivalent per week     Comment: occasional     Medication list has been reviewed and updated.   Physical Exam  Constitutional: She is oriented to person, place, and time. She appears well-developed. No distress.  HENT:  Head: Normocephalic and atraumatic.  Neck: Normal range  of motion. Neck supple. No thyromegaly present.  Cardiovascular: Normal rate, regular rhythm and normal heart sounds.   Pulmonary/Chest: Effort normal and breath sounds normal. No respiratory distress.  Abdominal: Soft. Bowel sounds are normal. She exhibits no distension and no mass. There is tenderness. There is no guarding.  Musculoskeletal: Normal range of motion. She  exhibits no edema or tenderness.  Neurological: She is alert and oriented to person, place, and time.  Skin: Skin is warm and dry. No rash noted.  Psychiatric: She has a normal mood and affect. Her behavior is normal. Thought content normal.  Nursing note and vitals reviewed.   BP 120/80 (BP Location: Right Arm, Patient Position: Sitting, Cuff Size: Large)   Pulse 73   Temp 98.3 F (36.8 C) (Oral)   Resp 16   Ht 5\' 2"  (1.575 m)   Wt 201 lb 3.2 oz (91.3 kg)   LMP 04/16/2016 Comment: spotting every 2 weeks  SpO2 100%   BMI 36.80 kg/m   Assessment and Plan: 1. Essential (primary) hypertension controlled - lisinopril (PRINIVIL,ZESTRIL) 40 MG tablet; Take 1 tablet (40 mg total) by mouth daily.  Dispense: 90 tablet; Refill: 3 - metoprolol succinate (TOPROL-XL) 25 MG 24 hr tablet; Take 1 tablet (25 mg total) by mouth daily.  Dispense: 90 tablet; Refill: 3  2. Restless leg Increase to 6 capsules per day - gabapentin (NEURONTIN) 300 MG capsule; Take 6 capsules (1,800 mg total) by mouth at bedtime.  Dispense: 540 capsule; Refill: 3  3. Clinical depression stable - sertraline (ZOLOFT) 100 MG tablet; Take 1 tablet (100 mg total) by mouth daily.  Dispense: 90 tablet; Refill: 3  4. Dysuria Treat empirically since UA negative but sx consistent - ciprofloxacin (CIPRO) 250 MG tablet; Take 1 tablet (250 mg total) by mouth 2 (two) times daily.  Dispense: 10 tablet; Refill: 0 - POCT urinalysis dipstick  5. Colon cancer screening Due for 10 yr follow up - Ambulatory referral to Gastroenterology  6. Hot flash, menopausal On HRT, followed by GYN   Bari EdwardLaura Styles Fambro, MD [facility]  04/30/2016

## 2016-06-04 ENCOUNTER — Other Ambulatory Visit: Payer: Self-pay | Admitting: Internal Medicine

## 2016-06-04 DIAGNOSIS — I10 Essential (primary) hypertension: Secondary | ICD-10-CM

## 2016-07-04 ENCOUNTER — Other Ambulatory Visit: Payer: Self-pay | Admitting: Internal Medicine

## 2016-07-04 DIAGNOSIS — F32A Depression, unspecified: Secondary | ICD-10-CM

## 2016-07-04 DIAGNOSIS — F329 Major depressive disorder, single episode, unspecified: Secondary | ICD-10-CM

## 2016-10-16 ENCOUNTER — Other Ambulatory Visit: Payer: Self-pay | Admitting: Internal Medicine

## 2016-10-16 DIAGNOSIS — Z1231 Encounter for screening mammogram for malignant neoplasm of breast: Secondary | ICD-10-CM

## 2016-10-29 ENCOUNTER — Encounter: Payer: Self-pay | Admitting: Internal Medicine

## 2016-10-29 ENCOUNTER — Ambulatory Visit
Admission: RE | Admit: 2016-10-29 | Discharge: 2016-10-29 | Disposition: A | Discharge status: Home / Self Care | Payer: 59 | Source: Ambulatory Visit | Attending: Internal Medicine | Admitting: Internal Medicine

## 2016-10-29 ENCOUNTER — Ambulatory Visit (INDEPENDENT_AMBULATORY_CARE_PROVIDER_SITE_OTHER): Payer: 59 | Admitting: Internal Medicine

## 2016-10-29 VITALS — BP 114/72 | HR 78 | Ht 62.0 in | Wt 202.0 lb

## 2016-10-29 DIAGNOSIS — G2581 Restless legs syndrome: Secondary | ICD-10-CM | POA: Diagnosis not present

## 2016-10-29 DIAGNOSIS — Z0000 Encounter for general adult medical examination without abnormal findings: Secondary | ICD-10-CM | POA: Diagnosis not present

## 2016-10-29 DIAGNOSIS — Z1231 Encounter for screening mammogram for malignant neoplasm of breast: Secondary | ICD-10-CM | POA: Insufficient documentation

## 2016-10-29 DIAGNOSIS — I10 Essential (primary) hypertension: Secondary | ICD-10-CM

## 2016-10-29 DIAGNOSIS — K219 Gastro-esophageal reflux disease without esophagitis: Secondary | ICD-10-CM

## 2016-10-29 DIAGNOSIS — F3341 Major depressive disorder, recurrent, in partial remission: Secondary | ICD-10-CM

## 2016-10-29 LAB — POCT URINALYSIS DIPSTICK
Bilirubin, UA: NEGATIVE
GLUCOSE UA: NEGATIVE
Ketones, UA: NEGATIVE
NITRITE UA: NEGATIVE
Protein, UA: NEGATIVE
Spec Grav, UA: 1.01
UROBILINOGEN UA: 0.2
pH, UA: 6

## 2016-10-29 NOTE — Patient Instructions (Addendum)
Call about cologard coverage  Call back with correct Zoloft dose if needed Breast Self-Awareness Introduction Breast self-awareness means being familiar with how your breasts look and feel. It involves checking your breasts regularly and reporting any changes to your health care provider. Practicing breast self-awareness is important. A change in your breasts can be a sign of a serious medical problem. Being familiar with how your breasts look and feel allows you to find any problems early, when treatment is more likely to be successful. All women should practice breast self-awareness, including women who have had breast implants. How to do a breast self-exam One way to learn what is normal for your breasts and whether your breasts are changing is to do a breast self-exam. To do a breast self-exam: Look for Changes  1. Remove all the clothing above your waist. 2. Stand in front of a mirror in a room with good lighting. 3. Put your hands on your hips. 4. Push your hands firmly downward. 5. Compare your breasts in the mirror. Look for differences between them (asymmetry), such as:  Differences in shape.  Differences in size.  Puckers, dips, and bumps in one breast and not the other. 6. Look at each breast for changes in your skin, such as:  Redness.  Scaly areas. 7. Look for changes in your nipples, such as:  Discharge.  Bleeding.  Dimpling.  Redness.  A change in position. Feel for Changes  Carefully feel your breasts for lumps and changes. It is best to do this while lying on your back on the floor and again while sitting or standing in the shower or tub with soapy water on your skin. Feel each breast in the following way:  Place the arm on the side of the breast you are examining above your head.  Feel your breast with the other hand.  Start in the nipple area and make  inch (2 cm) overlapping circles to feel your breast. Use the pads of your three middle fingers to do  this. Apply light pressure, then medium pressure, then firm pressure. The light pressure will allow you to feel the tissue closest to the skin. The medium pressure will allow you to feel the tissue that is a little deeper. The firm pressure will allow you to feel the tissue close to the ribs.  Continue the overlapping circles, moving downward over the breast until you feel your ribs below your breast.  Move one finger-width toward the center of the body. Continue to use the  inch (2 cm) overlapping circles to feel your breast as you move slowly up toward your collarbone.  Continue the up and down exam using all three pressures until you reach your armpit. Write Down What You Find  Write down what is normal for each breast and any changes that you find. Keep a written record with breast changes or normal findings for each breast. By writing this information down, you do not need to depend only on memory for size, tenderness, or location. Write down where you are in your menstrual cycle, if you are still menstruating. If you are having trouble noticing differences in your breasts, do not get discouraged. With time you will become more familiar with the variations in your breasts and more comfortable with the exam. How often should I examine my breasts? Examine your breasts every month. If you are breastfeeding, the best time to examine your breasts is after a feeding or after using a breast pump. If you  menstruate, the best time to examine your breasts is 5-7 days after your period is over. During your period, your breasts are lumpier, and it may be more difficult to notice changes. When should I see my health care provider? See your health care provider if you notice:  A change in shape or size of your breasts or nipples.  A change in the skin of your breast or nipples, such as a reddened or scaly area.  Unusual discharge from your nipples.  A lump or thick area that was not there  before.  Pain in your breasts.  Anything that concerns you. This information is not intended to replace advice given to you by your health care provider. Make sure you discuss any questions you have with your health care provider. Document Released: 08/20/2005 Document Revised: 01/26/2016 Document Reviewed: 07/10/2015  2017 Elsevier

## 2016-10-29 NOTE — Progress Notes (Signed)
Date:  10/29/2016   Name:  Faith Coleman   DOB:  09/16/1955   MRN:  [id number]   Chief Complaint: Annual Exam Faith Coleman is a 61 y.o. female who presents today for her Complete Annual Exam. She feels well. She reports exercising none. She reports she is sleeping fairly well. She has seen GYN - had endometrial bx (neg) for persistent spotting.  Also had breast exam and mammogram is scheduled for today.  Hypertension  Pertinent negatives include no chest pain, headaches, palpitations or shortness of breath.  Gastroesophageal Reflux  She reports no abdominal pain, no chest pain, no coughing or no wheezing. Pertinent negatives include no fatigue.  Restless leg syndrome - treated with gabapentin and does fairly well. Usually takes medication earlier in the evening for better efficacy.  Depression - on zoloft daily since husband died.  Now trying to cut back to a lower dose and stop if possible.  Review of Systems  Constitutional: Negative for chills, fatigue and fever.  HENT: Negative for congestion, hearing loss, tinnitus, trouble swallowing and voice change.   Eyes: Negative for visual disturbance.  Respiratory: Negative for cough, chest tightness, shortness of breath and wheezing.   Cardiovascular: Negative for chest pain, palpitations and leg swelling.  Gastrointestinal: Negative for abdominal pain, constipation, diarrhea and vomiting.  Endocrine: Negative for polydipsia and polyuria.  Genitourinary: Positive for menstrual problem (random vaginal spotting). Negative for dysuria, frequency, genital sores, vaginal bleeding and vaginal discharge.  Musculoskeletal: Negative for arthralgias, gait problem and joint swelling.  Skin: Negative for color change and rash.  Allergic/Immunologic: Negative for environmental allergies.  Neurological: Negative for dizziness, tremors, light-headedness and headaches.  Hematological: Negative for adenopathy. Does not bruise/bleed  easily.  Psychiatric/Behavioral: Negative for dysphoric mood and sleep disturbance. The patient is not nervous/anxious.     Patient Active Problem List   Diagnosis Date Noted  . Recurrent major depressive disorder, in partial remission (HCC) 03/18/2015  . Essential (primary) hypertension 03/18/2015  . Acid reflux 03/18/2015  . Hot flash, menopausal 03/18/2015  . Restless leg 03/18/2015    Prior to Admission medications   Medication Sig Start Date End Date Taking? Authorizing Provider  Estradiol-Norethindrone Acet 0.5-0.1 MG tablet TAKE 1 TABLET BY MOUTH DAILY 09/14/15  Yes Reubin MilanLaura H Neill Jurewicz, MD  gabapentin (NEURONTIN) 300 MG capsule Take 6 capsules (1,800 mg total) by mouth at bedtime. 04/30/16  Yes Reubin MilanLaura H Ameya Kutz, MD  lisinopril (PRINIVIL,ZESTRIL) 40 MG tablet TAKE 1 TABLET (40 MG TOTAL) BY MOUTH DAILY. 06/04/16  Yes Reubin MilanLaura H Thorn Demas, MD  metoprolol succinate (TOPROL-XL) 25 MG 24 hr tablet Take 1 tablet (25 mg total) by mouth daily. 04/30/16  Yes Reubin MilanLaura H Amitai Delaughter, MD  sertraline (ZOLOFT) 100 MG tablet TAKE 1 TABLET (100 MG TOTAL) BY MOUTH DAILY. 07/04/16  Yes Reubin MilanLaura H Seraphine Gudiel, MD    No Known Allergies  Past Surgical History:  Procedure Laterality Date  . CHOLECYSTECTOMY  1999  . DILATION AND CURETTAGE OF UTERUS  2012   benign  . ENDOMETRIAL BIOPSY  04/2016   Came back negative.  . TUBAL LIGATION      Social History  Substance Use Topics  . Smoking status: Never Smoker  . Smokeless tobacco: Never Used  . Alcohol use 1.2 oz/week    2 Standard drinks or equivalent per week     Comment: occasional     Medication list has been reviewed and updated.   Physical Exam  Constitutional: She is oriented  to person, place, and time. She appears well-developed and well-nourished. No distress.  HENT:  Head: Normocephalic and atraumatic.  Right Ear: Tympanic membrane and ear canal normal.  Left Ear: Tympanic membrane and ear canal normal.  Nose: Right sinus exhibits no maxillary  sinus tenderness. Left sinus exhibits no maxillary sinus tenderness.  Mouth/Throat: Uvula is midline and oropharynx is clear and moist.  Eyes: Conjunctivae and EOM are normal. Right eye exhibits no discharge. Left eye exhibits no discharge. No scleral icterus.  Neck: Normal range of motion. Carotid bruit is not present. No erythema present. No thyromegaly present.  Cardiovascular: Normal rate, regular rhythm, normal heart sounds and normal pulses.   Pulmonary/Chest: Effort normal. No respiratory distress. She has no wheezes.  Abdominal: Soft. Bowel sounds are normal. There is no hepatosplenomegaly. There is no tenderness. There is no CVA tenderness.  Musculoskeletal: Normal range of motion.  Lymphadenopathy:    She has no cervical adenopathy.    She has no axillary adenopathy.  Neurological: She is alert and oriented to person, place, and time. She has normal reflexes. No cranial nerve deficit or sensory deficit.  Skin: Skin is warm, dry and intact. No rash noted.  Psychiatric: She has a normal mood and affect. Her speech is normal and behavior is normal. Thought content normal. Cognition and memory are normal.  Nursing note and vitals reviewed.   BP 114/72   Pulse 78   Ht 5\' 2"  (1.575 m)   Wt 202 lb (91.6 kg)   SpO2 98%   BMI 36.95 kg/m   Assessment and Plan: 1. Annual physical exam Mammogram later today Not taking calcium due to hx renal stones May need vitamin d No transportation for colonoscopy - will check on Cologard coverage - Lipid panel - POCT urinalysis dipstick - VITAMIN D 25 Hydroxy (Vit-D Deficiency, Fractures)  2. Essential (primary) hypertension controlled - CBC with Differential/Platelet - Comprehensive metabolic panel  3. Gastroesophageal reflux disease, esophagitis presence not specified Minimal sx - on no medications  4. Recurrent major depressive disorder, in partial remission (HCC) Doing well - taper zoloft to lowest effective dose - TSH  5.  Restless leg Treated with gabapentin - TSH   Bari Edward, MD [facility]  10/29/2016

## 2016-10-31 ENCOUNTER — Other Ambulatory Visit: Payer: Self-pay | Admitting: Internal Medicine

## 2016-10-31 LAB — CBC WITH DIFFERENTIAL/PLATELET
BASOS ABS: 0.1 10*3/uL (ref 0.0–0.2)
BASOS: 1 %
EOS (ABSOLUTE): 0.3 10*3/uL (ref 0.0–0.4)
Eos: 4 %
Hematocrit: 41.4 % (ref 34.0–46.6)
Hemoglobin: 13.6 g/dL (ref 11.1–15.9)
IMMATURE GRANS (ABS): 0 10*3/uL (ref 0.0–0.1)
Immature Granulocytes: 0 %
Lymphocytes Absolute: 2.1 10*3/uL (ref 0.7–3.1)
Lymphs: 33 %
MCH: 28.9 pg (ref 26.6–33.0)
MCHC: 32.9 g/dL (ref 31.5–35.7)
MCV: 88 fL (ref 79–97)
MONOS ABS: 0.5 10*3/uL (ref 0.1–0.9)
Monocytes: 8 %
NEUTROS ABS: 3.5 10*3/uL (ref 1.4–7.0)
Neutrophils: 54 %
PLATELETS: 243 10*3/uL (ref 150–379)
RBC: 4.71 x10E6/uL (ref 3.77–5.28)
RDW: 14 % (ref 12.3–15.4)
WBC: 6.4 10*3/uL (ref 3.4–10.8)

## 2016-10-31 LAB — COMPREHENSIVE METABOLIC PANEL
A/G RATIO: 1.7 (ref 1.2–2.2)
ALBUMIN: 4.3 g/dL (ref 3.5–5.5)
ALT: 15 IU/L (ref 0–32)
AST: 16 IU/L (ref 0–40)
Alkaline Phosphatase: 80 IU/L (ref 39–117)
BILIRUBIN TOTAL: 0.2 mg/dL (ref 0.0–1.2)
BUN/Creatinine Ratio: 18 (ref 9–23)
BUN: 13 mg/dL (ref 6–24)
CHLORIDE: 102 mmol/L (ref 96–106)
CO2: 24 mmol/L (ref 18–29)
Calcium: 9 mg/dL (ref 8.7–10.2)
Creatinine, Ser: 0.73 mg/dL (ref 0.57–1.00)
GFR calc non Af Amer: 91 mL/min/{1.73_m2} (ref >59)
GFR, EST AFRICAN AMERICAN: 105 mL/min/{1.73_m2} (ref >59)
Globulin, Total: 2.5 g/dL (ref 1.5–4.5)
Glucose: 87 mg/dL (ref 65–99)
POTASSIUM: 4.2 mmol/L (ref 3.5–5.2)
Sodium: 141 mmol/L (ref 134–144)
TOTAL PROTEIN: 6.8 g/dL (ref 6.0–8.5)

## 2016-10-31 LAB — LIPID PANEL
Chol/HDL Ratio: 3.6 ratio units (ref 0.0–4.4)
Cholesterol, Total: 201 mg/dL — ABNORMAL HIGH (ref 100–199)
HDL: 56 mg/dL (ref >39)
LDL Calculated: 117 mg/dL — ABNORMAL HIGH (ref 0–99)
Triglycerides: 139 mg/dL (ref 0–149)
VLDL CHOLESTEROL CAL: 28 mg/dL (ref 5–40)

## 2016-10-31 LAB — TSH: TSH: 7.64 u[IU]/mL — AB (ref 0.450–4.500)

## 2016-10-31 LAB — VITAMIN D 25 HYDROXY (VIT D DEFICIENCY, FRACTURES): Vit D, 25-Hydroxy: 15.9 ng/mL — ABNORMAL LOW (ref 30.0–100.0)

## 2016-11-01 ENCOUNTER — Other Ambulatory Visit: Payer: Self-pay | Admitting: Internal Medicine

## 2016-11-01 DIAGNOSIS — E039 Hypothyroidism, unspecified: Secondary | ICD-10-CM

## 2016-11-01 DIAGNOSIS — E034 Atrophy of thyroid (acquired): Secondary | ICD-10-CM

## 2016-11-01 MED ORDER — LEVOTHYROXINE SODIUM 50 MCG PO TABS
50.0000 ug | ORAL_TABLET | Freq: Every day | ORAL | 3 refills | Status: DC
Start: 2016-11-01 — End: 2017-08-11

## 2016-11-01 NOTE — Progress Notes (Signed)
[facility]

## 2017-01-31 ENCOUNTER — Other Ambulatory Visit: Payer: Self-pay

## 2017-01-31 DIAGNOSIS — E0590 Thyrotoxicosis, unspecified without thyrotoxic crisis or storm: Secondary | ICD-10-CM

## 2017-02-02 LAB — TSH: TSH: 2.21 u[IU]/mL (ref 0.450–4.500)

## 2017-02-19 ENCOUNTER — Other Ambulatory Visit: Payer: Self-pay | Admitting: Internal Medicine

## 2017-02-19 DIAGNOSIS — I10 Essential (primary) hypertension: Secondary | ICD-10-CM

## 2017-06-03 ENCOUNTER — Other Ambulatory Visit: Payer: Self-pay | Admitting: Internal Medicine

## 2017-06-03 DIAGNOSIS — G2581 Restless legs syndrome: Secondary | ICD-10-CM

## 2017-06-03 DIAGNOSIS — I10 Essential (primary) hypertension: Secondary | ICD-10-CM

## 2017-06-10 NOTE — Telephone Encounter (Signed)
lvm to call back and schedule appt.

## 2017-06-18 ENCOUNTER — Ambulatory Visit: Payer: Self-pay | Admitting: Internal Medicine

## 2017-07-30 ENCOUNTER — Other Ambulatory Visit: Payer: Self-pay | Admitting: Internal Medicine

## 2017-07-30 DIAGNOSIS — F32A Depression, unspecified: Secondary | ICD-10-CM

## 2017-07-30 DIAGNOSIS — F329 Major depressive disorder, single episode, unspecified: Secondary | ICD-10-CM

## 2017-08-11 ENCOUNTER — Other Ambulatory Visit: Payer: Self-pay | Admitting: Internal Medicine

## 2017-10-10 ENCOUNTER — Other Ambulatory Visit: Payer: Self-pay | Admitting: Internal Medicine

## 2017-10-10 DIAGNOSIS — I10 Essential (primary) hypertension: Secondary | ICD-10-CM

## 2017-10-10 NOTE — Telephone Encounter (Signed)
Patient mention that she will call back to reschedule she will be out of town in May.

## 2017-12-03 ENCOUNTER — Other Ambulatory Visit: Payer: Self-pay | Admitting: Internal Medicine

## 2017-12-03 DIAGNOSIS — I10 Essential (primary) hypertension: Secondary | ICD-10-CM

## 2017-12-03 NOTE — Telephone Encounter (Signed)
lvm to set up appt

## 2017-12-18 ENCOUNTER — Other Ambulatory Visit: Payer: Self-pay | Admitting: Internal Medicine

## 2017-12-31 ENCOUNTER — Encounter: Payer: Self-pay | Admitting: Internal Medicine

## 2017-12-31 ENCOUNTER — Ambulatory Visit (INDEPENDENT_AMBULATORY_CARE_PROVIDER_SITE_OTHER): Payer: 59 | Admitting: Internal Medicine

## 2017-12-31 VITALS — BP 132/84 | HR 84 | Resp 16 | Ht 62.0 in | Wt 203.5 lb

## 2017-12-31 DIAGNOSIS — Z1211 Encounter for screening for malignant neoplasm of colon: Secondary | ICD-10-CM | POA: Diagnosis not present

## 2017-12-31 DIAGNOSIS — E034 Atrophy of thyroid (acquired): Secondary | ICD-10-CM | POA: Diagnosis not present

## 2017-12-31 DIAGNOSIS — G2581 Restless legs syndrome: Secondary | ICD-10-CM | POA: Diagnosis not present

## 2017-12-31 DIAGNOSIS — I10 Essential (primary) hypertension: Secondary | ICD-10-CM | POA: Diagnosis not present

## 2017-12-31 DIAGNOSIS — M79672 Pain in left foot: Secondary | ICD-10-CM | POA: Diagnosis not present

## 2017-12-31 DIAGNOSIS — F3341 Major depressive disorder, recurrent, in partial remission: Secondary | ICD-10-CM | POA: Diagnosis not present

## 2017-12-31 MED ORDER — LISINOPRIL 40 MG PO TABS: 40.0000 mg | ORAL_TABLET | Freq: Every day | ORAL | 3 refills | Status: DC

## 2017-12-31 MED ORDER — METOPROLOL SUCCINATE ER 25 MG PO TB24: 25.0000 mg | ORAL_TABLET | Freq: Every day | ORAL | 3 refills | Status: DC

## 2017-12-31 MED ORDER — GABAPENTIN 300 MG PO CAPS: 1200.0000 mg | ORAL_CAPSULE | Freq: Every day | ORAL | 3 refills | Status: DC

## 2017-12-31 NOTE — Patient Instructions (Signed)
Health Maintenance for Postmenopausal Women Menopause is a normal process in which your reproductive ability comes to an end. This process happens gradually over a span of months to years, usually between the ages of 22 and 9. Menopause is complete when you have missed 12 consecutive menstrual periods. It is important to talk with your health care provider about some of the most common conditions that affect postmenopausal women, such as heart disease, cancer, and bone loss (osteoporosis). Adopting a healthy lifestyle and getting preventive care can help to promote your health and wellness. Those actions can also lower your chances of developing some of these common conditions. What should I know about menopause? During menopause, you may experience a number of symptoms, such as:  Moderate-to-severe hot flashes.  Night sweats.  Decrease in sex drive.  Mood swings.  Headaches.  Tiredness.  Irritability.  Memory problems.  Insomnia.  Choosing to treat or not to treat menopausal changes is an individual decision that you make with your health care provider. What should I know about hormone replacement therapy and supplements? Hormone therapy products are effective for treating symptoms that are associated with menopause, such as hot flashes and night sweats. Hormone replacement carries certain risks, especially as you become older. If you are thinking about using estrogen or estrogen with progestin treatments, discuss the benefits and risks with your health care provider. What should I know about heart disease and stroke? Heart disease, heart attack, and stroke become more likely as you age. This may be due, in part, to the hormonal changes that your body experiences during menopause. These can affect how your body processes dietary fats, triglycerides, and cholesterol. Heart attack and stroke are both medical emergencies. There are many things that you can do to help prevent heart disease  and stroke:  Have your blood pressure checked at least every 1-2 years. High blood pressure causes heart disease and increases the risk of stroke.  If you are 53-22 years old, ask your health care provider if you should take aspirin to prevent a heart attack or a stroke.  Do not use any tobacco products, including cigarettes, chewing tobacco, or electronic cigarettes. If you need help quitting, ask your health care provider.  It is important to eat a healthy diet and maintain a healthy weight. ? Be sure to include plenty of vegetables, fruits, low-fat dairy products, and lean protein. ? Avoid eating foods that are high in solid fats, added sugars, or salt (sodium).  Get regular exercise. This is one of the most important things that you can do for your health. ? Try to exercise for at least 150 minutes each week. The type of exercise that you do should increase your heart rate and make you sweat. This is known as moderate-intensity exercise. ? Try to do strengthening exercises at least twice each week. Do these in addition to the moderate-intensity exercise.  Know your numbers.Ask your health care provider to check your cholesterol and your blood glucose. Continue to have your blood tested as directed by your health care provider.  What should I know about cancer screening? There are several types of cancer. Take the following steps to reduce your risk and to catch any cancer development as early as possible. Breast Cancer  Practice breast self-awareness. ? This means understanding how your breasts normally appear and feel. ? It also means doing regular breast self-exams. Let your health care provider know about any changes, no matter how small.  If you are 40  or older, have a clinician do a breast exam (clinical breast exam or CBE) every year. Depending on your age, family history, and medical history, it may be recommended that you also have a yearly breast X-ray (mammogram).  If you  have a family history of breast cancer, talk with your health care provider about genetic screening.  If you are at high risk for breast cancer, talk with your health care provider about having an MRI and a mammogram every year.  Breast cancer (BRCA) gene test is recommended for women who have family members with BRCA-related cancers. Results of the assessment will determine the need for genetic counseling and BRCA1 and for BRCA2 testing. BRCA-related cancers include these types: ? Breast. This occurs in males or females. ? Ovarian. ? Tubal. This may also be called fallopian tube cancer. ? Cancer of the abdominal or pelvic lining (peritoneal cancer). ? Prostate. ? Pancreatic.  Cervical, Uterine, and Ovarian Cancer Your health care provider may recommend that you be screened regularly for cancer of the pelvic organs. These include your ovaries, uterus, and vagina. This screening involves a pelvic exam, which includes checking for microscopic changes to the surface of your cervix (Pap test).  For women ages 21-65, health care providers may recommend a pelvic exam and a Pap test every three years. For women ages 79-65, they may recommend the Pap test and pelvic exam, combined with testing for human papilloma virus (HPV), every five years. Some types of HPV increase your risk of cervical cancer. Testing for HPV may also be done on women of any age who have unclear Pap test results.  Other health care providers may not recommend any screening for nonpregnant women who are considered low risk for pelvic cancer and have no symptoms. Ask your health care provider if a screening pelvic exam is right for you.  If you have had past treatment for cervical cancer or a condition that could lead to cancer, you need Pap tests and screening for cancer for at least 20 years after your treatment. If Pap tests have been discontinued for you, your risk factors (such as having a new sexual partner) need to be  reassessed to determine if you should start having screenings again. Some women have medical problems that increase the chance of getting cervical cancer. In these cases, your health care provider may recommend that you have screening and Pap tests more often.  If you have a family history of uterine cancer or ovarian cancer, talk with your health care provider about genetic screening.  If you have vaginal bleeding after reaching menopause, tell your health care provider.  There are currently no reliable tests available to screen for ovarian cancer.  Lung Cancer Lung cancer screening is recommended for adults 69-62 years old who are at high risk for lung cancer because of a history of smoking. A yearly low-dose CT scan of the lungs is recommended if you:  Currently smoke.  Have a history of at least 30 pack-years of smoking and you currently smoke or have quit within the past 15 years. A pack-year is smoking an average of one pack of cigarettes per day for one year.  Yearly screening should:  Continue until it has been 15 years since you quit.  Stop if you develop a health problem that would prevent you from having lung cancer treatment.  Colorectal Cancer  This type of cancer can be detected and can often be prevented.  Routine colorectal cancer screening usually begins at  age 42 and continues through age 45.  If you have risk factors for colon cancer, your health care provider may recommend that you be screened at an earlier age.  If you have a family history of colorectal cancer, talk with your health care provider about genetic screening.  Your health care provider may also recommend using home test kits to check for hidden blood in your stool.  A small camera at the end of a tube can be used to examine your colon directly (sigmoidoscopy or colonoscopy). This is done to check for the earliest forms of colorectal cancer.  Direct examination of the colon should be repeated every  5-10 years until age 71. However, if early forms of precancerous polyps or small growths are found or if you have a family history or genetic risk for colorectal cancer, you may need to be screened more often.  Skin Cancer  Check your skin from head to toe regularly.  Monitor any moles. Be sure to tell your health care provider: ? About any new moles or changes in moles, especially if there is a change in a mole's shape or color. ? If you have a mole that is larger than the size of a pencil eraser.  If any of your family members has a history of skin cancer, especially at a young age, talk with your health care provider about genetic screening.  Always use sunscreen. Apply sunscreen liberally and repeatedly throughout the day.  Whenever you are outside, protect yourself by wearing long sleeves, pants, a wide-brimmed hat, and sunglasses.  What should I know about osteoporosis? Osteoporosis is a condition in which bone destruction happens more quickly than new bone creation. After menopause, you may be at an increased risk for osteoporosis. To help prevent osteoporosis or the bone fractures that can happen because of osteoporosis, the following is recommended:  If you are 46-71 years old, get at least 1,000 mg of calcium and at least 600 mg of vitamin D per day.  If you are older than age 55 but younger than age 65, get at least 1,200 mg of calcium and at least 600 mg of vitamin D per day.  If you are older than age 54, get at least 1,200 mg of calcium and at least 800 mg of vitamin D per day.  Smoking and excessive alcohol intake increase the risk of osteoporosis. Eat foods that are rich in calcium and vitamin D, and do weight-bearing exercises several times each week as directed by your health care provider. What should I know about how menopause affects my mental health? Depression may occur at any age, but it is more common as you become older. Common symptoms of depression  include:  Low or sad mood.  Changes in sleep patterns.  Changes in appetite or eating patterns.  Feeling an overall lack of motivation or enjoyment of activities that you previously enjoyed.  Frequent crying spells.  Talk with your health care provider if you think that you are experiencing depression. What should I know about immunizations? It is important that you get and maintain your immunizations. These include:  Tetanus, diphtheria, and pertussis (Tdap) booster vaccine.  Influenza every year before the flu season begins.  Pneumonia vaccine.  Shingles vaccine.  Your health care provider may also recommend other immunizations. This information is not intended to replace advice given to you by your health care provider. Make sure you discuss any questions you have with your health care provider. Document Released: 10/12/2005  Document Revised: 03/09/2016 Document Reviewed: 05/24/2015 Elsevier Interactive Patient Education  2018 [employer]  

## 2017-12-31 NOTE — Progress Notes (Signed)
Date:  12/31/2017   Name:  Faith Coleman   DOB:  08-21-1956   MRN:  [id number]   Chief Complaint: Hypertension Hypertension  This is a chronic problem. The problem is controlled. Pertinent negatives include no chest pain, headaches, palpitations or shortness of breath. Past treatments include ACE inhibitors and beta blockers. There are no compliance problems.  Identifiable causes of hypertension include a thyroid problem.  Thyroid Problem  Presents for follow-up visit. Patient reports no anxiety, fatigue, palpitations or tremors. The symptoms have been stable.  Depression         This is a chronic problem.  The problem occurs rarely.  Associated symptoms include no fatigue, no appetite change and no headaches.  Past treatments include SSRIs - Selective serotonin reuptake inhibitors.  Compliance with treatment is good.  Past medical history includes thyroid problem.   RLS - taking gabapentin 1200 mg at night with good effect. HM - she continues to see her GYN in [place].  She is on HRT with good control of her hot flashes.  She agrees to do Cologuard but declines colonoscopy.  She is up to date on immunizations - she is not interested in Shingrix.  Mammogram was done last year - now being ordered by GYN.  Review of Systems  Constitutional: Negative for appetite change, fatigue, fever and unexpected weight change.  HENT: Negative for tinnitus and trouble swallowing.   Eyes: Negative for visual disturbance.  Respiratory: Negative for cough, chest tightness and shortness of breath.   Cardiovascular: Negative for chest pain, palpitations and leg swelling.  Gastrointestinal: Negative for abdominal pain.  Endocrine: Negative for polydipsia and polyuria.  Genitourinary: Negative for dysuria and hematuria.  Musculoskeletal: Negative for arthralgias.  Neurological: Negative for tremors, numbness and headaches.  Psychiatric/Behavioral: Positive for depression. Negative for dysphoric mood  and sleep disturbance. The patient is not nervous/anxious.     Patient Active Problem List   Diagnosis Date Noted  . Hypothyroidism 11/01/2016  . Recurrent major depressive disorder, in partial remission (HCC) 03/18/2015  . Essential (primary) hypertension 03/18/2015  . Acid reflux 03/18/2015  . Hot flash, menopausal 03/18/2015  . Restless leg 03/18/2015    Prior to Admission medications   Medication Sig Start Date End Date Taking? Authorizing Provider  Estradiol-Norethindrone Acet 0.5-0.1 MG tablet TAKE 1 TABLET BY MOUTH DAILY 09/14/15   Reubin Milan, MD  gabapentin (NEURONTIN) 300 MG capsule TAKE 6 CAPSULES BY MOUTH AT BEDTIME. 06/03/17   Reubin Milan, MD  levothyroxine (SYNTHROID, LEVOTHROID) 50 MCG tablet TAKE 1 TABLET BY MOUTH  DAILY 08/11/17   Reubin Milan, MD         lisinopril (PRINIVIL,ZESTRIL) 40 MG tablet TAKE 1 TABLET BY MOUTH  DAILY 12/03/17   Reubin Milan, MD  metoprolol succinate (TOPROL-XL) 25 MG 24 hr tablet TAKE 1 TABLET BY MOUTH  DAILY 02/20/17   Reubin Milan, MD  sertraline (ZOLOFT) 100 MG tablet TAKE 1 TABLET BY MOUTH  DAILY 07/31/17   Reubin Milan, MD  valACYclovir (VALTREX) 1000 MG tablet  10/18/16   [provider]    No Known Allergies  Past Surgical History:  Procedure Laterality Date  . CHOLECYSTECTOMY  1999  . DILATION AND CURETTAGE OF UTERUS  2012   benign  . ENDOMETRIAL BIOPSY  04/2016   Came back negative.  . TUBAL LIGATION      Social History   Tobacco Use  . Smoking status: Never Smoker  .  Smokeless tobacco: Never Used  Substance Use Topics  . Alcohol use: Yes    Alcohol/week: 1.2 oz    Types: 2 Standard drinks or equivalent per week    Comment: occasional  . Drug use: No     Medication list has been reviewed and updated.  PHQ 2/9 Scores 10/25/2015  PHQ - 2 Score 0    Physical Exam  Constitutional: She is oriented to person, place, and time. She appears well-developed. No distress.  HENT:    Head: Normocephalic and atraumatic.  Right Ear: External ear normal.  Left Ear: External ear normal.  Nose: Nose normal.  Mouth/Throat: Oropharynx is clear and moist.  Eyes: Pupils are equal, round, and reactive to light.  Neck: Normal range of motion. Neck supple.  Cardiovascular: Normal rate, regular rhythm and normal heart sounds.  Pulmonary/Chest: Effort normal and breath sounds normal. No respiratory distress.  Musculoskeletal: Normal range of motion.       Feet:  Lymphadenopathy:    She has no cervical adenopathy.  Neurological: She is alert and oriented to person, place, and time. She has normal strength. No sensory deficit.  Reflex Scores:      Bicep reflexes are 2+ on the right side and 2+ on the left side.      Patellar reflexes are 3+ on the right side and 3+ on the left side. Skin: Skin is warm and dry. No rash noted.  Psychiatric: She has a normal mood and affect. Her behavior is normal. Thought content normal.    BP 132/84   Pulse 84   Resp 16   Ht  (1.575 m)   Wt 203 lb 8 oz (92.3 kg)   SpO2 96%   BMI 37.22 kg/m   Assessment and Plan: 1. Essential (primary) hypertension controlled - lisinopril (PRINIVIL,ZESTRIL) 40 MG tablet; Take 1 tablet (40 mg total) by mouth daily.  Dispense: 90 tablet; Refill: 3 - metoprolol succinate (TOPROL-XL) 25 MG 24 hr tablet; Take 1 tablet (25 mg total) by mouth daily.  Dispense: 90 tablet; Refill: 3 - CBC with Differential/Platelet - Comprehensive metabolic panel  2. 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 Recurrent major depressive disorder, in partial remission (HCC) Doing well on current therapy  4. Restless leg controlled - gabapentin (NEURONTIN) 300 MG capsule; Take 4 capsules (1,200 mg total) by mouth at bedtime.  Dispense: 540 capsule; Refill: 3  5. Colon cancer screening - Cologuard  6. Foot pain, left Recommend Podiatry evaluation   Meds ordered this encounter   Medications  . gabapentin (NEURONTIN) 300 MG capsule    Sig: Take 4 capsules (1,200 mg total) by mouth at bedtime.    Dispense:  540 capsule    Refill:  3  . lisinopril (PRINIVIL,ZESTRIL) 40 MG tablet    Sig: Take 1 tablet (40 mg total) by mouth daily.    Dispense:  90 tablet    Refill:  3  . metoprolol succinate (TOPROL-XL) 25 MG 24 hr tablet    Sig: Take 1 tablet (25 mg total) by mouth daily.    Dispense:  90 tablet    Refill:  3    Partially dictated using Animal nutritionist. Any errors are unintentional.  Bari Edward, MD [facility]  12/31/2017

## 2018-01-01 LAB — CBC WITH DIFFERENTIAL/PLATELET
BASOS ABS: 0 10*3/uL (ref 0.0–0.2)
BASOS: 0 %
EOS (ABSOLUTE): 0.2 10*3/uL (ref 0.0–0.4)
Eos: 3 %
Hematocrit: 39.6 % (ref 34.0–46.6)
Hemoglobin: 12.7 g/dL (ref 11.1–15.9)
IMMATURE GRANS (ABS): 0 10*3/uL (ref 0.0–0.1)
IMMATURE GRANULOCYTES: 0 %
LYMPHS: 28 %
Lymphocytes Absolute: 2 10*3/uL (ref 0.7–3.1)
MCH: 28.9 pg (ref 26.6–33.0)
MCHC: 32.1 g/dL (ref 31.5–35.7)
MCV: 90 fL (ref 79–97)
Monocytes Absolute: 0.6 10*3/uL (ref 0.1–0.9)
Monocytes: 8 %
Neutrophils Absolute: 4.3 10*3/uL (ref 1.4–7.0)
Neutrophils: 61 %
PLATELETS: 231 10*3/uL (ref 150–379)
RBC: 4.4 x10E6/uL (ref 3.77–5.28)
RDW: 14.4 % (ref 12.3–15.4)
WBC: 7.1 10*3/uL (ref 3.4–10.8)

## 2018-01-01 LAB — COMPREHENSIVE METABOLIC PANEL
ALT: 21 IU/L (ref 0–32)
AST: 19 IU/L (ref 0–40)
Albumin/Globulin Ratio: 1.9 (ref 1.2–2.2)
Albumin: 4.2 g/dL (ref 3.6–4.8)
Alkaline Phosphatase: 88 IU/L (ref 39–117)
BUN/Creatinine Ratio: 18 (ref 12–28)
BUN: 13 mg/dL (ref 8–27)
Bilirubin Total: 0.4 mg/dL (ref 0.0–1.2)
CALCIUM: 9.4 mg/dL (ref 8.7–10.3)
CO2: 24 mmol/L (ref 20–29)
Chloride: 102 mmol/L (ref 96–106)
Creatinine, Ser: 0.74 mg/dL (ref 0.57–1.00)
GFR, EST AFRICAN AMERICAN: 102 mL/min/{1.73_m2} (ref >59)
GFR, EST NON AFRICAN AMERICAN: 88 mL/min/{1.73_m2} (ref >59)
GLUCOSE: 76 mg/dL (ref 65–99)
Globulin, Total: 2.2 g/dL (ref 1.5–4.5)
Potassium: 4.6 mmol/L (ref 3.5–5.2)
Sodium: 141 mmol/L (ref 134–144)
TOTAL PROTEIN: 6.4 g/dL (ref 6.0–8.5)

## 2018-01-01 LAB — TSH: TSH: 4.31 u[IU]/mL (ref 0.450–4.500)

## 2018-01-06 ENCOUNTER — Encounter: Payer: Self-pay | Admitting: Internal Medicine

## 2018-01-20 ENCOUNTER — Other Ambulatory Visit: Payer: Self-pay | Admitting: Internal Medicine

## 2018-01-20 DIAGNOSIS — F329 Major depressive disorder, single episode, unspecified: Secondary | ICD-10-CM

## 2018-01-20 DIAGNOSIS — F32A Depression, unspecified: Secondary | ICD-10-CM

## 2018-02-26 LAB — COLOGUARD

## 2018-02-27 ENCOUNTER — Telehealth: Payer: Self-pay | Admitting: Internal Medicine

## 2018-02-27 NOTE — Telephone Encounter (Signed)
Please inform patient that Cologuard was negative.  Will repeat in 3 years.

## 2018-02-28 NOTE — Telephone Encounter (Signed)
Patient notified

## 2018-07-26 ENCOUNTER — Other Ambulatory Visit: Payer: Self-pay | Admitting: Internal Medicine

## 2018-07-26 DIAGNOSIS — F329 Major depressive disorder, single episode, unspecified: Secondary | ICD-10-CM

## 2018-07-26 DIAGNOSIS — F32A Depression, unspecified: Secondary | ICD-10-CM

## 2018-08-07 ENCOUNTER — Other Ambulatory Visit: Payer: Self-pay | Admitting: Internal Medicine

## 2018-08-07 ENCOUNTER — Telehealth: Payer: Self-pay

## 2018-08-07 ENCOUNTER — Other Ambulatory Visit: Payer: Self-pay

## 2018-08-07 DIAGNOSIS — E034 Atrophy of thyroid (acquired): Secondary | ICD-10-CM

## 2018-08-07 DIAGNOSIS — F32A Depression, unspecified: Secondary | ICD-10-CM

## 2018-08-07 DIAGNOSIS — G2581 Restless legs syndrome: Secondary | ICD-10-CM

## 2018-08-07 DIAGNOSIS — I10 Essential (primary) hypertension: Secondary | ICD-10-CM

## 2018-08-07 DIAGNOSIS — F329 Major depressive disorder, single episode, unspecified: Secondary | ICD-10-CM

## 2018-08-07 MED ORDER — SERTRALINE HCL 100 MG PO TABS: 100.0000 mg | ORAL_TABLET | Freq: Every day | ORAL | 0 refills | Status: DC

## 2018-08-07 MED ORDER — GABAPENTIN 300 MG PO CAPS: 1200.0000 mg | ORAL_CAPSULE | Freq: Every day | ORAL | 0 refills | Status: DC

## 2018-08-07 MED ORDER — LEVOTHYROXINE SODIUM 50 MCG PO TABS: 50.0000 ug | ORAL_TABLET | Freq: Every day | ORAL | 0 refills | Status: DC

## 2018-08-07 MED ORDER — LISINOPRIL 40 MG PO TABS: 40.0000 mg | ORAL_TABLET | Freq: Every day | ORAL | 0 refills | Status: DC

## 2018-08-07 MED ORDER — METOPROLOL SUCCINATE ER 25 MG PO TB24: 25.0000 mg | ORAL_TABLET | Freq: Every day | ORAL | 0 refills | Status: DC
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
## 2018-08-25 NOTE — Telephone Encounter (Signed)
Lvm to call back -ah11/25@8 :52am

## 2018-09-04 NOTE — Telephone Encounter (Signed)
lvm to call back -ah01/02 °

## 2018-09-07 ENCOUNTER — Other Ambulatory Visit: Payer: Self-pay | Admitting: Internal Medicine

## 2018-09-07 DIAGNOSIS — E034 Atrophy of thyroid (acquired): Secondary | ICD-10-CM

## 2018-09-29 LAB — HM PAP SMEAR: HM Pap smear: NORMAL

## 2018-10-04 DIAGNOSIS — Z9889 Other specified postprocedural states: Secondary | ICD-10-CM

## 2018-10-04 HISTORY — DX: Other specified postprocedural states: Z98.890

## 2018-10-04 LAB — HM PAP SMEAR: HM Pap smear: NORMAL

## 2018-10-25 ENCOUNTER — Other Ambulatory Visit: Payer: Self-pay | Admitting: Internal Medicine

## 2018-10-25 DIAGNOSIS — I10 Essential (primary) hypertension: Secondary | ICD-10-CM

## 2018-11-09 ENCOUNTER — Other Ambulatory Visit: Payer: Self-pay | Admitting: Internal Medicine

## 2018-11-09 DIAGNOSIS — I10 Essential (primary) hypertension: Secondary | ICD-10-CM

## 2018-12-24 ENCOUNTER — Other Ambulatory Visit: Payer: Self-pay | Admitting: Internal Medicine

## 2018-12-24 DIAGNOSIS — G2581 Restless legs syndrome: Secondary | ICD-10-CM

## 2019-02-19 ENCOUNTER — Other Ambulatory Visit: Payer: Self-pay | Admitting: Internal Medicine

## 2019-02-19 DIAGNOSIS — I10 Essential (primary) hypertension: Secondary | ICD-10-CM

## 2019-02-23 ENCOUNTER — Other Ambulatory Visit: Payer: Self-pay | Admitting: Internal Medicine

## 2019-02-23 DIAGNOSIS — I10 Essential (primary) hypertension: Secondary | ICD-10-CM

## 2019-03-09 ENCOUNTER — Ambulatory Visit (INDEPENDENT_AMBULATORY_CARE_PROVIDER_SITE_OTHER): Payer: 59 | Admitting: Internal Medicine

## 2019-03-09 ENCOUNTER — Other Ambulatory Visit: Payer: Self-pay

## 2019-03-09 ENCOUNTER — Telehealth: Payer: Self-pay

## 2019-03-09 ENCOUNTER — Encounter: Payer: Self-pay | Admitting: Internal Medicine

## 2019-03-09 VITALS — BP 120/68 | HR 58 | Ht 62.0 in | Wt 204.6 lb

## 2019-03-09 DIAGNOSIS — Z6837 Body mass index (BMI) 37.0-37.9, adult: Secondary | ICD-10-CM | POA: Insufficient documentation

## 2019-03-09 DIAGNOSIS — Z6832 Body mass index (BMI) 32.0-32.9, adult: Secondary | ICD-10-CM | POA: Diagnosis not present

## 2019-03-09 DIAGNOSIS — F3341 Major depressive disorder, recurrent, in partial remission: Secondary | ICD-10-CM | POA: Diagnosis not present

## 2019-03-09 DIAGNOSIS — Z0000 Encounter for general adult medical examination without abnormal findings: Secondary | ICD-10-CM

## 2019-03-09 DIAGNOSIS — E034 Atrophy of thyroid (acquired): Secondary | ICD-10-CM

## 2019-03-09 DIAGNOSIS — I10 Essential (primary) hypertension: Secondary | ICD-10-CM | POA: Diagnosis not present

## 2019-03-09 DIAGNOSIS — G2581 Restless legs syndrome: Secondary | ICD-10-CM

## 2019-03-09 LAB — POCT URINALYSIS DIPSTICK
Bilirubin, UA: NEGATIVE
Blood, UA: NEGATIVE
Glucose, UA: NEGATIVE
Ketones, UA: NEGATIVE
Leukocytes, UA: NEGATIVE
Nitrite, UA: NEGATIVE
Protein, UA: NEGATIVE
Spec Grav, UA: 1.025 (ref 1.010–1.025)
Urobilinogen, UA: 0.2 E.U./dL
pH, UA: 5 (ref 5.0–8.0)

## 2019-03-09 MED ORDER — LEVOTHYROXINE SODIUM 50 MCG PO TABS: 50.0000 ug | ORAL_TABLET | Freq: Every day | ORAL | 3 refills | Status: DC

## 2019-03-09 MED ORDER — GABAPENTIN 600 MG PO TABS: 2400.0000 mg | ORAL_TABLET | Freq: Every day | ORAL | 3 refills | Status: DC

## 2019-03-09 MED ORDER — CONTRAVE 8-90 MG PO TB12: 2.0000 | ORAL_TABLET | Freq: Two times a day (BID) | ORAL | 1 refills | Status: DC

## 2019-03-09 MED ORDER — METOPROLOL SUCCINATE ER 25 MG PO TB24: 25.0000 mg | ORAL_TABLET | Freq: Every day | ORAL | 3 refills | Status: DC

## 2019-03-09 NOTE — Progress Notes (Signed)
Date:  03/09/2019   Name:  Faith Coleman   DOB:  01/02/1956   MRN:  [id number]   Chief Complaint: Annual Exam (Breast Exam.) Faith Coleman is a 63 y.o. female who presents today for her Complete Annual Exam. She feels well. She reports exercising walking. She reports she is sleeping well. She sees GYN for Pap and mammograms.  Cologuard 2019 Mammogram 2019 Pap 2020  Obesity - she continues to struggle with her weight.  She would like some advice and try medication if possible. Hypertension This is a chronic problem. The problem is controlled. Pertinent negatives include no chest pain, headaches, palpitations or shortness of breath. Past treatments include ACE inhibitors. There are no compliance problems.  Identifiable causes of hypertension include a thyroid problem.  Depression        This is a chronic problem.  The problem has been resolved since onset.  Associated symptoms include no fatigue and no headaches.  Past treatments include SSRIs - Selective serotonin reuptake inhibitors.  Compliance with treatment is good.  Previous treatment provided significant relief.  Past medical history includes thyroid problem.   Thyroid Problem Presents for follow-up visit. Patient reports no anxiety, constipation, diarrhea, fatigue, leg swelling, palpitations or tremors. The symptoms have been stable.  RLS - taking gabapentin 2400 mg at HS with good control of sx. Vaginal spotting - she recently has a D&C that was normal but still has intermittent spotting despite HRT.  She plans to follow up with GYN soon.  Lab Results  Component Value Date   TSH 4.310 12/31/2017   Lab Results  Component Value Date   CREATININE 0.74 12/31/2017   BUN 13 12/31/2017   NA 141 12/31/2017   K 4.6 12/31/2017   CL 102 12/31/2017   CO2 24 12/31/2017     Review of Systems  Constitutional: Negative for chills, fatigue and fever.  HENT: Negative for congestion, hearing loss, tinnitus, trouble  swallowing and voice change.   Eyes: Negative for visual disturbance.  Respiratory: Negative for cough, chest tightness, shortness of breath and wheezing.   Cardiovascular: Negative for chest pain, palpitations and leg swelling.  Gastrointestinal: Negative for abdominal pain, constipation, diarrhea and vomiting.  Endocrine: Negative for polydipsia and polyuria.  Genitourinary: Negative for dysuria, frequency, genital sores, vaginal bleeding and vaginal discharge.  Musculoskeletal: Negative for arthralgias, gait problem and joint swelling.  Skin: Negative for color change and rash.  Allergic/Immunologic: Negative for environmental allergies.  Neurological: Negative for dizziness, tremors, light-headedness and headaches.  Hematological: Negative for adenopathy. Does not bruise/bleed easily.  Psychiatric/Behavioral: Positive for depression. Negative for dysphoric mood and sleep disturbance. The patient is not nervous/anxious.     Patient Active Problem List   Diagnosis Date Noted  . Hypothyroidism due to acquired atrophy of thyroid 03/09/2019  . Recurrent major depressive disorder, in partial remission (HCC) 03/18/2015  . Essential (primary) hypertension 03/18/2015  . Acid reflux 03/18/2015  . Hot flash, menopausal 03/18/2015  . Restless leg 03/18/2015    No Known Allergies  Past Surgical History:  Procedure Laterality Date  . CHOLECYSTECTOMY  1999  . DILATION AND CURETTAGE OF UTERUS  2012   benign  . ENDOMETRIAL BIOPSY  04/2016   Came back negative.  . TUBAL LIGATION      Social History   Tobacco Use  . Smoking status: Never Smoker  . Smokeless tobacco: Never Used  Substance Use Topics  . Alcohol use: Yes    Alcohol/week: 2.0  standard drinks    Types: 2 Standard drinks or equivalent per week    Comment: occasional  . Drug use: No     Medication list has been reviewed and updated.  Current Meds  Medication Sig  . Estradiol-Norethindrone Acet 0.5-0.1 MG tablet TAKE  1 TABLET BY MOUTH DAILY  . gabapentin (NEURONTIN) 300 MG capsule TAKE FOUR CAPSULES BY MOUTH EVERY NIGHT AT BEDTIME (Patient taking differently: Take 6 tablets at bedtime.)  . levothyroxine (SYNTHROID, LEVOTHROID) 50 MCG tablet TAKE ONE TABLET BY MOUTH DAILY  . lisinopril (ZESTRIL) 40 MG tablet TAKE 1 TABLET BY MOUTH DAILY  . metoprolol succinate (TOPROL-XL) 25 MG 24 hr tablet TAKE ONE TABLET BY MOUTH DAILY  . sertraline (ZOLOFT) 100 MG tablet Take 1 tablet (100 mg total) by mouth daily. (Patient taking differently: Take 50 mg by mouth daily. )  . valACYclovir (VALTREX) 1000 MG tablet     PHQ 2/9 Scores 03/09/2019 10/25/2015  PHQ - 2 Score 0 0  PHQ- 9 Score 0 -    BP Readings from Last 3 Encounters:  03/09/19 120/68  12/31/17 132/84  10/29/16 114/72    Physical Exam Vitals signs and nursing note reviewed.  Constitutional:      General: She is not in acute distress.    Appearance: She is well-developed. She is obese.  HENT:     Head: Normocephalic and atraumatic.     Right Ear: Tympanic membrane and ear canal normal.     Left Ear: Tympanic membrane and ear canal normal.     Nose:     Right Sinus: No maxillary sinus tenderness.     Left Sinus: No maxillary sinus tenderness.  Eyes:     General: No scleral icterus.       Right eye: No discharge.        Left eye: No discharge.     Conjunctiva/sclera: Conjunctivae normal.  Neck:     Musculoskeletal: Normal range of motion. No erythema.     Thyroid: No thyromegaly.     Vascular: No carotid bruit.  Cardiovascular:     Rate and Rhythm: Normal rate and regular rhythm.     Pulses: Normal pulses.     Heart sounds: Normal heart sounds.  Pulmonary:     Effort: Pulmonary effort is normal. No respiratory distress.     Breath sounds: No wheezing.  Abdominal:     General: Bowel sounds are normal.     Palpations: Abdomen is soft.     Tenderness: There is no abdominal tenderness.  Musculoskeletal: Normal range of motion.     Right lower  leg: No edema.     Left lower leg: No edema.  Lymphadenopathy:     Cervical: No cervical adenopathy.  Skin:    General: Skin is warm and dry.     Capillary Refill: Capillary refill takes less than 2 seconds.     Findings: No rash.  Neurological:     Mental Status: She is alert and oriented to person, place, and time.     Cranial Nerves: No cranial nerve deficit.     Sensory: No sensory deficit.     Deep Tendon Reflexes: Reflexes are normal and symmetric.  Psychiatric:        Speech: Speech normal.        Behavior: Behavior normal.        Thought Content: Thought content normal.     Wt Readings from Last 3 Encounters:  03/09/19 204 lb 9.6 oz (92.8  kg)  12/31/17 203 lb 8 oz (92.3 kg)  10/29/16 202 lb (91.6 kg)    BP 120/68   Pulse (!) 58   Ht 5\' 2"  (1.575 m)   Wt 204 lb 9.6 oz (92.8 kg)   SpO2 98%   BMI 37.42 kg/m   Assessment and Plan: 1. Annual physical exam Normal exam except for weight - Lipid panel - POCT urinalysis dipstick  2. Essential (primary) hypertension controlled - CBC with Differential/Platelet - Comprehensive metabolic panel - metoprolol succinate (TOPROL-XL) 25 MG 24 hr tablet; Take 1 tablet (25 mg total) by mouth daily.  Dispense: 90 tablet; Refill: 3  3. 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  Dispense: 90 tablet; Refill: 3  4. Recurrent major depressive disorder, in partial remission ([place]) Doing well on medication  5. BMI 32.0-32.9,adult Will try Contrave - Rx sent and anticipate PA Consider combining this with intermittent fasting Follow up for BP and weight one month after starting - Naltrexone-buPROPion HCl ER (CONTRAVE) 8-90 MG TB12; Take 2 tablets by mouth 2 (two) times a day.  Dispense: 120 tablet; Refill: 1  6. Restless leg Doing well - gabapentin (NEURONTIN) 600 MG tablet; Take 4 tablets (2,400 mg total)  by mouth at bedtime.  Dispense: 360 tablet; Refill: 3   Partially dictated using Editor, commissioning. Any errors are unintentional.  Halina Maidens, MD [facility]  03/09/2019

## 2019-03-09 NOTE — Telephone Encounter (Signed)
Patient came in today and her last Insurance card scanned was from 2018 from Mamm[facility]. Need her updated card ASAP for a PA I need to complete. The last card has a different family practice on it and not [facility].   Thanks.

## 2019-03-10 LAB — COMPREHENSIVE METABOLIC PANEL
ALT: 17 IU/L (ref 0–32)
AST: 19 IU/L (ref 0–40)
Albumin/Globulin Ratio: 2 (ref 1.2–2.2)
Albumin: 4.3 g/dL (ref 3.8–4.8)
Alkaline Phosphatase: 75 IU/L (ref 39–117)
BUN/Creatinine Ratio: 17 (ref 12–28)
BUN: 13 mg/dL (ref 8–27)
Bilirubin Total: 0.3 mg/dL (ref 0.0–1.2)
CO2: 19 mmol/L — ABNORMAL LOW (ref 20–29)
Calcium: 9 mg/dL (ref 8.7–10.3)
Chloride: 102 mmol/L (ref 96–106)
Creatinine, Ser: 0.77 mg/dL (ref 0.57–1.00)
GFR calc Af Amer: 96 mL/min/{1.73_m2} (ref >59)
GFR calc non Af Amer: 84 mL/min/{1.73_m2} (ref >59)
Globulin, Total: 2.2 g/dL (ref 1.5–4.5)
Glucose: 92 mg/dL (ref 65–99)
Potassium: 4.3 mmol/L (ref 3.5–5.2)
Sodium: 138 mmol/L (ref 134–144)
Total Protein: 6.5 g/dL (ref 6.0–8.5)

## 2019-03-10 LAB — CBC WITH DIFFERENTIAL/PLATELET
Basophils Absolute: 0 10*3/uL (ref 0.0–0.2)
Basos: 1 %
EOS (ABSOLUTE): 0.2 10*3/uL (ref 0.0–0.4)
Eos: 3 %
Hematocrit: 39.7 % (ref 34.0–46.6)
Hemoglobin: 13.5 g/dL (ref 11.1–15.9)
Immature Grans (Abs): 0 10*3/uL (ref 0.0–0.1)
Immature Granulocytes: 0 %
Lymphocytes Absolute: 1.8 10*3/uL (ref 0.7–3.1)
Lymphs: 32 %
MCH: 30.7 pg (ref 26.6–33.0)
MCHC: 34 g/dL (ref 31.5–35.7)
MCV: 90 fL (ref 79–97)
Monocytes Absolute: 0.4 10*3/uL (ref 0.1–0.9)
Monocytes: 7 %
Neutrophils Absolute: 3.3 10*3/uL (ref 1.4–7.0)
Neutrophils: 57 %
Platelets: 228 10*3/uL (ref 150–450)
RBC: 4.4 x10E6/uL (ref 3.77–5.28)
RDW: 13.3 % (ref 11.7–15.4)
WBC: 5.8 10*3/uL (ref 3.4–10.8)

## 2019-03-10 LAB — LIPID PANEL
Chol/HDL Ratio: 3 ratio (ref 0.0–4.4)
Cholesterol, Total: 173 mg/dL (ref 100–199)
HDL: 57 mg/dL (ref >39)
LDL Calculated: 94 mg/dL (ref 0–99)
Triglycerides: 112 mg/dL (ref 0–149)
VLDL Cholesterol Cal: 22 mg/dL (ref 5–40)

## 2019-03-10 LAB — TSH+FREE T4
Free T4: 1.26 ng/dL (ref 0.82–1.77)
TSH: 2.11 u[IU]/mL (ref 0.450–4.500)

## 2019-03-12 ENCOUNTER — Encounter: Payer: Self-pay | Admitting: Internal Medicine

## 2019-03-18 ENCOUNTER — Other Ambulatory Visit: Payer: Self-pay | Admitting: Internal Medicine

## 2019-03-18 DIAGNOSIS — Z6832 Body mass index (BMI) 32.0-32.9, adult: Secondary | ICD-10-CM

## 2019-03-18 MED ORDER — CONTRAVE 8-90 MG PO TB12: 2.0000 | ORAL_TABLET | Freq: Two times a day (BID) | ORAL | 1 refills | Status: DC

## 2019-03-18 NOTE — Telephone Encounter (Signed)
Patient wants contrave sent to new pharmacy and she will pay cash.  Thank you.

## 2019-03-20 ENCOUNTER — Other Ambulatory Visit: Payer: Self-pay | Admitting: Internal Medicine

## 2019-03-20 DIAGNOSIS — E034 Atrophy of thyroid (acquired): Secondary | ICD-10-CM

## 2019-05-19 DIAGNOSIS — M5136 Other intervertebral disc degeneration, lumbar region: Secondary | ICD-10-CM | POA: Insufficient documentation

## 2019-06-04 HISTORY — PX: TOTAL VAGINAL HYSTERECTOMY: SHX2548

## 2019-12-24 ENCOUNTER — Encounter: Payer: Self-pay | Admitting: Internal Medicine

## 2020-01-27 LAB — HM DEXA SCAN

## 2020-02-10 ENCOUNTER — Telehealth: Payer: Self-pay | Admitting: Internal Medicine

## 2020-02-10 NOTE — Telephone Encounter (Signed)
Pharmacy called and requested that we authorize 90 day Rx for these medications:  gabapentin (NEURONTIN) 600 MG tablet levothyroxine (SYNTHROID) 50 MCG tablet metoprolol succinate (TOPROL-XL) 25 MG 24 hr tablet [  [facility] [place], [place] - [address]  [address] [place] [address]  Phone: [phone] Fax: [phone]

## 2020-02-11 ENCOUNTER — Other Ambulatory Visit: Payer: Self-pay

## 2020-02-11 DIAGNOSIS — E034 Atrophy of thyroid (acquired): Secondary | ICD-10-CM

## 2020-02-11 DIAGNOSIS — G2581 Restless legs syndrome: Secondary | ICD-10-CM

## 2020-02-11 DIAGNOSIS — I10 Essential (primary) hypertension: Secondary | ICD-10-CM

## 2020-02-11 MED ORDER — GABAPENTIN 600 MG PO TABS: 2400.0000 mg | ORAL_TABLET | Freq: Every day | ORAL | 0 refills | Status: DC

## 2020-02-11 MED ORDER — LEVOTHYROXINE SODIUM 50 MCG PO TABS: 50.0000 ug | ORAL_TABLET | Freq: Every day | ORAL | 0 refills | Status: DC

## 2020-02-11 MED ORDER — METOPROLOL SUCCINATE ER 25 MG PO TB24: 25.0000 mg | ORAL_TABLET | Freq: Every day | ORAL | 0 refills | Status: DC

## 2020-02-11 NOTE — Telephone Encounter (Signed)
Sent in medications to [facility].  CM

## 2020-03-10 ENCOUNTER — Other Ambulatory Visit: Payer: Self-pay | Admitting: Internal Medicine

## 2020-03-10 DIAGNOSIS — I10 Essential (primary) hypertension: Secondary | ICD-10-CM

## 2020-03-10 DIAGNOSIS — E034 Atrophy of thyroid (acquired): Secondary | ICD-10-CM

## 2020-03-10 NOTE — Telephone Encounter (Signed)
Patient was provided 30 day courtesy supply on 02/11/20.  LOV 03/09/19  Future visit 03/15/20. One week supply to bridge until the patient's appointment.

## 2020-03-12 ENCOUNTER — Other Ambulatory Visit: Payer: Self-pay | Admitting: Internal Medicine

## 2020-03-12 DIAGNOSIS — I10 Essential (primary) hypertension: Secondary | ICD-10-CM

## 2020-03-12 NOTE — Telephone Encounter (Signed)
Pt given enough refill to last until appt Requested Prescriptions  Pending Prescriptions Disp Refills  . lisinopril (ZESTRIL) 40 MG tablet [Pharmacy Med Name: LISINOPRIL 40 MG TABLET] 4 tablet 0    Sig: TAKE ONE TABLET BY MOUTH DAILY     Cardiovascular:  ACE Inhibitors Failed - 03/12/2020  2:55 PM      Failed - Cr in normal range and within 180 days    Creatinine, Ser  Date Value Ref Range Status  03/09/2019 0.77 0.57 - 1.00 mg/dL Final         Failed - K in normal range and within 180 days    Potassium  Date Value Ref Range Status  03/09/2019 4.3 3.5 - 5.2 mmol/L Final         Failed - Valid encounter within last 6 months    Recent Outpatient Visits          1 year ago Annual physical exam   [facility] Reubin Milan, MD   2 years ago Essential (primary) hypertension   [facility] Reubin Milan, MD   3 years ago Annual physical exam   [facility] Reubin Milan, MD   3 years ago Essential (primary) hypertension   [facility] Reubin Milan, MD   4 years ago Restless leg   [facility] Reubin Milan, MD      Future Appointments            In 3 days Reubin Milan, MD [facility]           Passed - Patient is not pregnant      Passed - Last BP in normal range    BP Readings from Last 1 Encounters:  03/09/19 120/68

## 2020-03-15 ENCOUNTER — Other Ambulatory Visit: Payer: Self-pay

## 2020-03-15 ENCOUNTER — Ambulatory Visit (INDEPENDENT_AMBULATORY_CARE_PROVIDER_SITE_OTHER): Payer: 59 | Admitting: Internal Medicine

## 2020-03-15 ENCOUNTER — Encounter: Payer: Self-pay | Admitting: Internal Medicine

## 2020-03-15 VITALS — BP 108/72 | HR 66 | Temp 97.8°F | Ht 62.0 in | Wt 190.0 lb

## 2020-03-15 DIAGNOSIS — E559 Vitamin D deficiency, unspecified: Secondary | ICD-10-CM | POA: Diagnosis not present

## 2020-03-15 DIAGNOSIS — E034 Atrophy of thyroid (acquired): Secondary | ICD-10-CM

## 2020-03-15 DIAGNOSIS — Z6834 Body mass index (BMI) 34.0-34.9, adult: Secondary | ICD-10-CM

## 2020-03-15 DIAGNOSIS — Z0000 Encounter for general adult medical examination without abnormal findings: Secondary | ICD-10-CM | POA: Diagnosis not present

## 2020-03-15 DIAGNOSIS — G2581 Restless legs syndrome: Secondary | ICD-10-CM | POA: Diagnosis not present

## 2020-03-15 DIAGNOSIS — I10 Essential (primary) hypertension: Secondary | ICD-10-CM

## 2020-03-15 DIAGNOSIS — M51369 Other intervertebral disc degeneration, lumbar region without mention of lumbar back pain or lower extremity pain: Secondary | ICD-10-CM

## 2020-03-15 DIAGNOSIS — M5136 Other intervertebral disc degeneration, lumbar region: Secondary | ICD-10-CM

## 2020-03-15 DIAGNOSIS — Z1231 Encounter for screening mammogram for malignant neoplasm of breast: Secondary | ICD-10-CM

## 2020-03-15 DIAGNOSIS — F3341 Major depressive disorder, recurrent, in partial remission: Secondary | ICD-10-CM

## 2020-03-15 LAB — POCT URINALYSIS DIPSTICK
Bilirubin, UA: NEGATIVE
Blood, UA: NEGATIVE
Glucose, UA: NEGATIVE
Ketones, UA: NEGATIVE
Leukocytes, UA: NEGATIVE
Nitrite, UA: NEGATIVE
Protein, UA: NEGATIVE
Spec Grav, UA: <=1.005 — AB (ref 1.010–1.025)
Urobilinogen, UA: 0.2 E.U./dL
pH, UA: 6 (ref 5.0–8.0)

## 2020-03-15 MED ORDER — LISINOPRIL 40 MG PO TABS: 40.0000 mg | ORAL_TABLET | Freq: Every day | ORAL | 1 refills | Status: DC

## 2020-03-15 MED ORDER — METOPROLOL SUCCINATE ER 25 MG PO TB24: 25.0000 mg | ORAL_TABLET | Freq: Every day | ORAL | 1 refills | Status: DC

## 2020-03-15 MED ORDER — GABAPENTIN 600 MG PO TABS: 2400.0000 mg | ORAL_TABLET | Freq: Every day | ORAL | 1 refills | Status: DC

## 2020-03-15 MED ORDER — LEVOTHYROXINE SODIUM 50 MCG PO TABS: 50.0000 ug | ORAL_TABLET | Freq: Every day | ORAL | 1 refills | Status: DC

## 2020-03-15 NOTE — Progress Notes (Signed)
Date:  03/15/2020   Name:  Faith Coleman   DOB:  12-27-1955   MRN:  [id number]   Chief Complaint: Annual Exam (no breast exam no pap wants capsules for gabapentin)  Faith Coleman is a 64 y.o. female who presents today for her Complete Annual Exam. She feels well. She reports exercising none. She reports she is sleeping well. Breast complaints none.  Recent normal mammogram.  She is recovering well from total hysterectomy - had some emotional issues and is now on prozac 40 mg.  Mammogram:11/2019 DEXA: 2021 normal Pap smear:discontinued Colonoscopy:cologuard 2019  Immunization History  Administered Date(s) Administered  . Influenza, Quadrivalent, Recombinant, Inj, Pf 06/03/2018, 06/11/2019, 06/22/2019  . Influenza,inj,Quad PF,6+ Mos 06/22/2016, 06/07/2017  . Influenza-Unspecified 06/07/2015, 06/03/2017  . PFIZER SARS-COV-2 Vaccination 09/10/2019, 09/27/2019  . Tdap 02/18/2017  . Zoster 02/16/2015    Hypertension This is a chronic problem. The problem is controlled (at home 120/80). Pertinent negatives include no chest pain, headaches, palpitations or shortness of breath. Past treatments include beta blockers and ACE inhibitors. The current treatment provides significant improvement. There are no compliance problems.  Identifiable causes of hypertension include a thyroid problem.  Thyroid Problem Presents for follow-up visit. Patient reports no anxiety, constipation, diarrhea, fatigue, palpitations or tremors. The symptoms have been stable.  Depression        This is a chronic problem.The problem is unchanged (worsened after hysterectomy and med changed to Prozac).  Associated symptoms include no fatigue and no headaches.  Past treatments include SSRIs - Selective serotonin reuptake inhibitors.  Past medical history includes thyroid problem.     Lab Results  Component Value Date   CREATININE 0.77 03/09/2019   BUN 13 03/09/2019   NA 138 03/09/2019   K 4.3  03/09/2019   CL 102 03/09/2019   CO2 19 (L) 03/09/2019   Lab Results  Component Value Date   CHOL 173 03/09/2019   HDL 57 03/09/2019   LDLCALC 94 03/09/2019   TRIG 112 03/09/2019   CHOLHDL 3.0 03/09/2019   Lab Results  Component Value Date   TSH 2.110 03/09/2019   Lab Results  Component Value Date   HGBA1C 5.6 10/25/2015   Lab Results  Component Value Date   WBC 5.8 03/09/2019   HGB 13.5 03/09/2019   HCT 39.7 03/09/2019   MCV 90 03/09/2019   PLT 228 03/09/2019   Lab Results  Component Value Date   ALT 17 03/09/2019   AST 19 03/09/2019   ALKPHOS 75 03/09/2019   BILITOT 0.3 03/09/2019     Review of Systems  Constitutional: Negative for chills, fatigue and fever.  HENT: Negative for congestion, hearing loss, tinnitus, trouble swallowing and voice change.   Eyes: Negative for visual disturbance.  Respiratory: Negative for cough, chest tightness, shortness of breath and wheezing.   Cardiovascular: Negative for chest pain, palpitations and leg swelling.  Gastrointestinal: Negative for abdominal pain, constipation, diarrhea and vomiting.  Endocrine: Negative for polydipsia and polyuria.  Genitourinary: Negative for dysuria, frequency, genital sores, vaginal bleeding and vaginal discharge.  Musculoskeletal: Positive for back pain. Negative for arthralgias, gait problem and joint swelling.  Skin: Negative for color change and rash.  Neurological: Negative for dizziness, tremors, light-headedness and headaches.  Hematological: Negative for adenopathy. Does not bruise/bleed easily.  Psychiatric/Behavioral: Positive for depression. Negative for dysphoric mood and sleep disturbance. The patient is not nervous/anxious.     Patient Active Problem List   Diagnosis Date Noted  .  DDD (degenerative disc disease), lumbar 05/19/2019  . Hypothyroidism due to acquired atrophy of thyroid 03/09/2019  . BMI 37.0-37.9, adult 03/09/2019  . Recurrent major depressive disorder, in partial  remission (HCC) 03/18/2015  . Essential (primary) hypertension 03/18/2015  . Acid reflux 03/18/2015  . Hot flash, menopausal 03/18/2015  . Restless leg 03/18/2015    Allergies  Allergen Reactions  . Adhesive  [Tape] Other (See Comments)    redness    Past Surgical History:  Procedure Laterality Date  . CHOLECYSTECTOMY  1999  . DILATION AND CURETTAGE OF UTERUS  2012   benign  . ENDOMETRIAL BIOPSY  04/2016   Came back negative.  Marland Kitchen TOTAL VAGINAL HYSTERECTOMY  06/2019  . TUBAL LIGATION      Social History   Tobacco Use  . Smoking status: Never Smoker  . Smokeless tobacco: Never Used  Substance Use Topics  . Alcohol use: Yes    Alcohol/week: 2.0 standard drinks    Types: 2 Standard drinks or equivalent per week    Comment: occasional  . Drug use: No     Medication list has been reviewed and updated.  Current Meds  Medication Sig  . FLUoxetine (PROZAC) 40 MG capsule Take 40 mg by mouth daily.  Marland Kitchen gabapentin (NEURONTIN) 600 MG tablet Take 4 tablets (2,400 mg total) by mouth at bedtime.  Marland Kitchen levothyroxine (SYNTHROID) 50 MCG tablet TAKE ONE TABLET BY MOUTH DAILY  . lisinopril (ZESTRIL) 40 MG tablet TAKE ONE TABLET BY MOUTH DAILY  . metoprolol succinate (TOPROL-XL) 25 MG 24 hr tablet TAKE 1 TABLET BY MOUTH DAILY  . PREMARIN 0.3 MG tablet Take 0.3 mg by mouth daily.    PHQ 2/9 Scores 03/15/2020 03/09/2019 10/25/2015  PHQ - 2 Score 0 0 0  PHQ- 9 Score 0 0 -    GAD 7 : Generalized Anxiety Score 03/15/2020  Nervous, Anxious, on Edge 0  Control/stop worrying 0  Worry too much - different things 0  Trouble relaxing 0  Restless 0  Easily annoyed or irritable 0  Afraid - awful might happen 0  Total GAD 7 Score 0  Anxiety Difficulty Not difficult at all    BP Readings from Last 3 Encounters:  03/15/20 108/72  03/09/19 120/68  12/31/17 132/84    Physical Exam Vitals and nursing note reviewed.  Constitutional:      General: She is not in acute distress.    Appearance:  She is well-developed.  HENT:     Head: Normocephalic and atraumatic.     Right Ear: Tympanic membrane and ear canal normal.     Left Ear: Tympanic membrane and ear canal normal.     Nose:     Right Sinus: No maxillary sinus tenderness.     Left Sinus: No maxillary sinus tenderness.  Eyes:     General: No scleral icterus.       Right eye: No discharge.        Left eye: No discharge.     Conjunctiva/sclera: Conjunctivae normal.  Neck:     Thyroid: No thyromegaly.     Vascular: No carotid bruit.  Cardiovascular:     Rate and Rhythm: Normal rate and regular rhythm.     Pulses: Normal pulses.     Heart sounds: Normal heart sounds.  Pulmonary:     Effort: Pulmonary effort is normal. No respiratory distress.     Breath sounds: No wheezing.  Abdominal:     General: Bowel sounds are normal.  Palpations: Abdomen is soft.     Tenderness: There is no abdominal tenderness.  Musculoskeletal:     Cervical back: Normal range of motion. No erythema.     Right lower leg: No edema.     Left lower leg: No edema.  Lymphadenopathy:     Cervical: No cervical adenopathy.  Skin:    General: Skin is warm and dry.     Findings: No rash.  Neurological:     Mental Status: She is alert and oriented to person, place, and time.     Cranial Nerves: No cranial nerve deficit.     Sensory: No sensory deficit.     Deep Tendon Reflexes: Reflexes are normal and symmetric.  Psychiatric:        Attention and Perception: Attention normal.        Mood and Affect: Mood normal.     Wt Readings from Last 3 Encounters:  03/15/20 190 lb (86.2 kg)  03/09/19 204 lb 9.6 oz (92.8 kg)  12/31/17 203 lb 8 oz (92.3 kg)    BP 108/72   Pulse 66   Temp 97.8 F (36.6 C) (Oral)   Ht 5\' 2"  (1.575 m)   Wt 190 lb (86.2 kg)   SpO2 97%   BMI 34.75 kg/m   Assessment and Plan: 1. Annual physical exam Normal exam except for weight Continue healthy diet, begin more regular exercise - Hemoglobin A1c - Lipid  panel - POCT urinalysis dipstick  2. Encounter for screening mammogram for breast cancer Recently done  3. Essential (primary) hypertension Clinically stable exam with well controlled BP. Tolerating medications without side effects at this time. Pt to continue current regimen and low sodium diet; benefits of regular exercise as able discussed. - lisinopril (ZESTRIL) 40 MG tablet; Take 1 tablet (40 mg total) by mouth daily.  Dispense: 90 tablet; Refill: 1 - metoprolol succinate (TOPROL-XL) 25 MG 24 hr tablet; Take 1 tablet (25 mg total) by mouth daily.  Dispense: 90 tablet; Refill: 1 - CBC with Differential/Platelet - Comprehensive metabolic panel  4. Hypothyroidism due to acquired atrophy of thyroid supplemented - levothyroxine (SYNTHROID) 50 MCG tablet; Take 1 tablet (50 mcg total) by mouth daily.  Dispense: 90 tablet; Refill: 1 - TSH + free T4  5. Recurrent major depressive disorder, in partial remission (HCC) Clinically stable on current regimen with good control of symptoms, No SI or HI. Will continue current therapy.  6. Restless leg Controlled on gabapentin - gabapentin (NEURONTIN) 600 MG tablet; Take 4 tablets (2,400 mg total) by mouth at bedtime.  Dispense: 360 tablet; Refill: 1  7. DDD (degenerative disc disease), lumbar New onset last year Seen by Ortho - referred to PT and gradually improved Still has some symptoms managed with home PT  8. BMI 34.0-34.9,adult Diet and exercise  9. Vitamin D deficiency Continue daily supplement - VITAMIN D 25 Hydroxy (Vit-D Deficiency, Fractures)   Partially dictated using 09-22-1974. Any errors are unintentional.  Animal nutritionist, MD [facility]  03/15/2020

## 2020-03-16 LAB — LIPID PANEL
Chol/HDL Ratio: 3.3 ratio (ref 0.0–4.4)
Cholesterol, Total: 180 mg/dL (ref 100–199)
HDL: 54 mg/dL (ref >39)
LDL Chol Calc (NIH): 106 mg/dL — ABNORMAL HIGH (ref 0–99)
Triglycerides: 110 mg/dL (ref 0–149)
VLDL Cholesterol Cal: 20 mg/dL (ref 5–40)

## 2020-03-16 LAB — COMPREHENSIVE METABOLIC PANEL
ALT: 12 IU/L (ref 0–32)
AST: 14 IU/L (ref 0–40)
Albumin/Globulin Ratio: 2 (ref 1.2–2.2)
Albumin: 4.4 g/dL (ref 3.8–4.8)
Alkaline Phosphatase: 95 IU/L (ref 48–121)
BUN/Creatinine Ratio: 14 (ref 12–28)
BUN: 10 mg/dL (ref 8–27)
Bilirubin Total: 0.4 mg/dL (ref 0.0–1.2)
CO2: 24 mmol/L (ref 20–29)
Calcium: 9.1 mg/dL (ref 8.7–10.3)
Chloride: 103 mmol/L (ref 96–106)
Creatinine, Ser: 0.71 mg/dL (ref 0.57–1.00)
GFR calc Af Amer: 106 mL/min/{1.73_m2} (ref >59)
GFR calc non Af Amer: 92 mL/min/{1.73_m2} (ref >59)
Globulin, Total: 2.2 g/dL (ref 1.5–4.5)
Glucose: 84 mg/dL (ref 65–99)
Potassium: 4.6 mmol/L (ref 3.5–5.2)
Sodium: 139 mmol/L (ref 134–144)
Total Protein: 6.6 g/dL (ref 6.0–8.5)

## 2020-03-16 LAB — CBC WITH DIFFERENTIAL/PLATELET
Basophils Absolute: 0.1 10*3/uL (ref 0.0–0.2)
Basos: 1 %
EOS (ABSOLUTE): 0.2 10*3/uL (ref 0.0–0.4)
Eos: 4 %
Hematocrit: 42.6 % (ref 34.0–46.6)
Hemoglobin: 13.8 g/dL (ref 11.1–15.9)
Immature Grans (Abs): 0 10*3/uL (ref 0.0–0.1)
Immature Granulocytes: 0 %
Lymphocytes Absolute: 1.7 10*3/uL (ref 0.7–3.1)
Lymphs: 30 %
MCH: 29 pg (ref 26.6–33.0)
MCHC: 32.4 g/dL (ref 31.5–35.7)
MCV: 90 fL (ref 79–97)
Monocytes Absolute: 0.4 10*3/uL (ref 0.1–0.9)
Monocytes: 8 %
Neutrophils Absolute: 3.2 10*3/uL (ref 1.4–7.0)
Neutrophils: 57 %
Platelets: 208 10*3/uL (ref 150–450)
RBC: 4.76 x10E6/uL (ref 3.77–5.28)
RDW: 13.1 % (ref 11.7–15.4)
WBC: 5.6 10*3/uL (ref 3.4–10.8)

## 2020-03-16 LAB — VITAMIN D 25 HYDROXY (VIT D DEFICIENCY, FRACTURES): Vit D, 25-Hydroxy: 37.7 ng/mL (ref 30.0–100.0)

## 2020-03-16 LAB — TSH+FREE T4
Free T4: 1.51 ng/dL (ref 0.82–1.77)
TSH: 1.6 u[IU]/mL (ref 0.450–4.500)

## 2020-03-16 LAB — HEMOGLOBIN A1C
Est. average glucose Bld gHb Est-mCnc: 108 mg/dL
Hgb A1c MFr Bld: 5.4 % (ref 4.8–5.6)

## 2020-09-26 ENCOUNTER — Other Ambulatory Visit: Payer: Self-pay | Admitting: Internal Medicine

## 2020-09-26 DIAGNOSIS — G2581 Restless legs syndrome: Secondary | ICD-10-CM

## 2020-09-26 DIAGNOSIS — I10 Essential (primary) hypertension: Secondary | ICD-10-CM

## 2020-09-26 MED ORDER — LISINOPRIL 40 MG PO TABS: 40.0000 mg | ORAL_TABLET | Freq: Every day | ORAL | 1 refills | Status: DC

## 2020-09-26 MED ORDER — GABAPENTIN 600 MG PO TABS: 2400.0000 mg | ORAL_TABLET | Freq: Every day | ORAL | 1 refills | Status: DC

## 2020-09-26 NOTE — Telephone Encounter (Signed)
Relation to pt: self  Call back number: [phone] Pharmacy: [facility] - [place], [place] - [address] [place] Phone:  [phone]  Fax:  [phone]       Reason for call:   Patient requesting lisinopril (ZESTRIL) 40 MG tablet and gabapentin (NEURONTIN) 600 MG tablet. Patient states she has 4 pills left and has contacted pharmacy already and was advised to call PCP .

## 2020-10-24 ENCOUNTER — Other Ambulatory Visit: Payer: Self-pay | Admitting: Internal Medicine

## 2020-10-24 ENCOUNTER — Encounter: Payer: Self-pay | Admitting: Internal Medicine

## 2020-10-24 DIAGNOSIS — E034 Atrophy of thyroid (acquired): Secondary | ICD-10-CM

## 2020-10-24 DIAGNOSIS — I10 Essential (primary) hypertension: Secondary | ICD-10-CM

## 2020-10-24 MED ORDER — METOPROLOL SUCCINATE ER 25 MG PO TB24: 25.0000 mg | ORAL_TABLET | Freq: Every day | ORAL | 1 refills | Status: DC

## 2020-10-24 MED ORDER — LEVOTHYROXINE SODIUM 50 MCG PO TABS: 50.0000 ug | ORAL_TABLET | Freq: Every day | ORAL | 1 refills | Status: DC

## 2020-11-02 ENCOUNTER — Encounter: Payer: Self-pay | Admitting: Internal Medicine

## 2020-11-02 ENCOUNTER — Other Ambulatory Visit: Payer: Self-pay | Admitting: Internal Medicine

## 2020-11-02 DIAGNOSIS — G2581 Restless legs syndrome: Secondary | ICD-10-CM

## 2020-11-02 MED ORDER — GABAPENTIN 600 MG PO TABS: 2400.0000 mg | ORAL_TABLET | Freq: Every day | ORAL | 0 refills | Status: DC

## 2020-12-19 ENCOUNTER — Other Ambulatory Visit: Payer: Self-pay

## 2020-12-19 ENCOUNTER — Encounter: Payer: Self-pay | Admitting: Internal Medicine

## 2020-12-19 DIAGNOSIS — G2581 Restless legs syndrome: Secondary | ICD-10-CM

## 2020-12-19 MED ORDER — GABAPENTIN 600 MG PO TABS: 2400.0000 mg | ORAL_TABLET | Freq: Every day | ORAL | 0 refills | Status: AC

## 2020-12-19 MED ORDER — GABAPENTIN 600 MG PO TABS: 2400.0000 mg | ORAL_TABLET | Freq: Every day | ORAL | 0 refills | Status: DC

## 2020-12-30 ENCOUNTER — Other Ambulatory Visit: Payer: Self-pay | Admitting: Internal Medicine

## 2020-12-30 DIAGNOSIS — I10 Essential (primary) hypertension: Secondary | ICD-10-CM

## 2021-01-03 ENCOUNTER — Other Ambulatory Visit: Payer: Self-pay | Admitting: Internal Medicine

## 2021-01-03 DIAGNOSIS — I10 Essential (primary) hypertension: Secondary | ICD-10-CM

## 2021-01-03 NOTE — Telephone Encounter (Signed)
Change of pharmacy Requested Prescriptions  Pending Prescriptions Disp Refills  . metoprolol succinate (TOPROL-XL) 25 MG 24 hr tablet [Pharmacy Med Name: METOPROLOL SUCC ER 25 MG TAB] 90 tablet 0    Sig: TAKE ONE TABLET BY MOUTH DAILY     Cardiovascular:  Beta Blockers Failed - 01/03/2021 10:32 AM      Failed - Valid encounter within last 6 months    Recent Outpatient Visits          9 months ago Annual physical exam   [facility] Reubin Milan, MD   1 year ago Annual physical exam   [facility] Reubin Milan, MD   3 years ago Essential (primary) hypertension   [facility] Reubin Milan, MD   4 years ago Annual physical exam   [facility] Reubin Milan, MD   4 years ago Essential (primary) hypertension   [facility] Reubin Milan, MD      Future Appointments            In 2 months Reubin Milan, MD [facility]           Passed - Last BP in normal range    BP Readings from Last 1 Encounters:  03/15/20 108/72         Passed - Last Heart Rate in normal range    Pulse Readings from Last 1 Encounters:  03/15/20 66

## 2021-02-09 ENCOUNTER — Encounter: Payer: Self-pay | Admitting: Internal Medicine

## 2021-02-11 ENCOUNTER — Other Ambulatory Visit: Payer: Self-pay | Admitting: Internal Medicine

## 2021-02-11 DIAGNOSIS — I10 Essential (primary) hypertension: Secondary | ICD-10-CM

## 2021-02-11 NOTE — Telephone Encounter (Signed)
Requested Prescriptions  Pending Prescriptions Disp Refills  . lisinopril (ZESTRIL) 40 MG tablet [Pharmacy Med Name: LISINOPRIL 40 MG TABLET] 30 tablet 0    Sig: TAKE ONE TABLET BY MOUTH DAILY     Cardiovascular:  ACE Inhibitors Failed - 02/11/2021  9:04 AM      Failed - Cr in normal range and within 180 days    Creatinine, Ser  Date Value Ref Range Status  03/15/2020 0.71 0.57 - 1.00 mg/dL Final         Failed - K in normal range and within 180 days    Potassium  Date Value Ref Range Status  03/15/2020 4.6 3.5 - 5.2 mmol/L Final         Failed - Valid encounter within last 6 months    Recent Outpatient Visits          11 months ago Annual physical exam   [facility] Re[facility] Milan, [facility]   1 year ago Annual physical exam   [facility] Reubin Milan, MD   3 years ago Essential (primary) hypertension   [facility] Reubin Milan, MD   4 years ago Annual physical exam   [facility] Reubin Milan, MD   4 years ago Essential (primary) hypertension   [facility] Reubin Milan, MD      Future Appointments            In 1 month Reubin Milan, MD [facility]           Passed - Patient is not pregnant      Passed - Last BP in normal range    BP Readings from Last 1 Encounters:  03/15/20 108/72         change of pharmacy

## 2021-03-20 ENCOUNTER — Encounter: Payer: 59 | Admitting: Internal Medicine

## 2021-04-07 ENCOUNTER — Other Ambulatory Visit: Payer: Self-pay | Admitting: Internal Medicine

## 2021-04-07 DIAGNOSIS — E034 Atrophy of thyroid (acquired): Secondary | ICD-10-CM

## 2021-04-14 ENCOUNTER — Other Ambulatory Visit: Payer: Self-pay | Admitting: Internal Medicine

## 2021-04-14 DIAGNOSIS — E034 Atrophy of thyroid (acquired): Secondary | ICD-10-CM

## 2021-04-14 NOTE — Telephone Encounter (Signed)
Requested medication (s) are due for refill today: no  Future visit scheduled:no  Notes to clinic:  Patient canceled appt on 03/20/2021 Due for appt and labs    Requested Prescriptions  Pending Prescriptions Disp Refills   levothyroxine (SYNTHROID) 50 MCG tablet [Pharmacy Med Name: LEVOTHYROXINE 50 MCG TABLET] 89 tablet 0    Sig: TAKE ONE TABLET BY MOUTH DAILY     Endocrinology:  Hypothyroid Agents Failed - 04/14/2021  9:59 AM      Failed - TSH needs to be rechecked within 3 months after an abnormal result. Refill until TSH is due.      Failed - TSH in normal range and within 360 days    TSH  Date Value Ref Range Status  03/15/2020 1.600 0.450 - 4.500 uIU/mL Final          Failed - Valid encounter within last 12 months    Recent Outpatient Visits           1 year ago Annual physical exam   [facility] Reubin Milan, MD   2 years ago Annual physical exam   [facility] Reubin Milan, MD   3 years ago Essential (primary) hypertension   [facility] Reubin Milan, MD   4 years ago Annual physical exam   [facility] Reubin Milan, MD   4 years ago Essential (primary) hypertension   [facility] Reubin Milan, MD

## 2021-06-28 ENCOUNTER — Other Ambulatory Visit: Payer: Self-pay | Admitting: Internal Medicine

## 2021-06-28 DIAGNOSIS — E034 Atrophy of thyroid (acquired): Secondary | ICD-10-CM

## 2021-06-28 NOTE — Telephone Encounter (Signed)
Lvm to have patient set up physical

## 2021-06-28 NOTE — Telephone Encounter (Signed)
Requested medication (s) are due for refill today: Yes  Requested medication (s) are on the active medication list: Yes  Last refill:  04/07/21  Future visit scheduled: No  Notes to clinic:  Left message for pt. To call and make appointment.    Requested Prescriptions  Pending Prescriptions Disp Refills   levothyroxine (SYNTHROID) 50 MCG tablet [Pharmacy Med Name: LEVOTHYROXINE 50 MCG TABLET] 89 tablet 0    Sig: TAKE ONE TABLET BY MOUTH DAILY     Endocrinology:  Hypothyroid Agents Failed - 06/28/2021  8:44 AM      Failed - TSH needs to be rechecked within 3 months after an abnormal result. Refill until TSH is due.      Failed - TSH in normal range and within 360 days    TSH  Date Value Ref Range Status  03/15/2020 1.600 0.450 - 4.500 uIU/mL Final          Failed - Valid encounter within last 12 months    Recent Outpatient Visits           1 year ago Annual physical exam   [facility] Reubin Milan, MD   2 years ago Annual physical exam   [facility] Reubin Milan, MD   3 years ago Essential (primary) hypertension   [facility] Reubin Milan, MD   4 years ago Annual physical exam   [facility] Reubin Milan, MD   5 years ago Essential (primary) hypertension   [facility] Reubin Milan, MD

## 2021-06-28 NOTE — Telephone Encounter (Signed)
Patient needs an appointment for future refills.  Please schedule CPE.  Patient cancelled CPE 03/20/21 and did not reschedule.

## 2021-08-11 ENCOUNTER — Other Ambulatory Visit: Payer: Self-pay | Admitting: Internal Medicine

## 2021-08-11 DIAGNOSIS — E034 Atrophy of thyroid (acquired): Secondary | ICD-10-CM

## 2021-08-11 NOTE — Telephone Encounter (Signed)
Requested medication (s) are due for refill today: yes  Requested medication (s) are on the active medication list: yes  Last refill:  04/07/21 #89/0RF  Future visit scheduled: no  Notes to clinic:  Patient canceled appt in July, must reschedule office visit with lab work prior to further refills.     Requested Prescriptions  Pending Prescriptions Disp Refills   levothyroxine (SYNTHROID) 50 MCG tablet [Pharmacy Med Name: LEVOTHYROXINE 50 MCG TABLET] 89 tablet 0    Sig: TAKE ONE TABLET BY MOUTH DAILY     Endocrinology:  Hypothyroid Agents Failed - 08/11/2021  8:19 AM      Failed - TSH needs to be rechecked within 3 months after an abnormal result. Refill until TSH is due.      Failed - TSH in normal range and within 360 days    TSH  Date Value Ref Range Status  03/15/2020 1.600 0.450 - 4.500 uIU/mL Final          Failed - Valid encounter within last 12 months    Recent Outpatient Visits           1 year ago Annual physical exam   [facility] Reubin Milan, MD   2 years ago Annual physical exam   [facility] Reubin Milan, MD   3 years ago Essential (primary) hypertension   [facility] Reubin Milan, MD   4 years ago Annual physical exam   [facility] Reubin Milan, MD   5 years ago Essential (primary) hypertension   [facility] Reubin Milan, MD
